# Patient Record
Sex: Female | Born: 1975
Health system: Southern US, Community
[De-identification: ages and names within clinical notes are randomized; demographics above are authoritative.]

## PROBLEM LIST (undated history)

## (undated) DIAGNOSIS — G43909 Migraine, unspecified, not intractable, without status migrainosus: Secondary | ICD-10-CM

## (undated) DIAGNOSIS — M47817 Spondylosis without myelopathy or radiculopathy, lumbosacral region: Secondary | ICD-10-CM

## (undated) DIAGNOSIS — I10 Essential (primary) hypertension: Secondary | ICD-10-CM

## (undated) HISTORY — PX: INTRAUTERINE DEVICE INSERTION: SHX323

## (undated) HISTORY — PX: OTHER SURGICAL HISTORY: SHX169

## (undated) HISTORY — DX: Migraine, unspecified, not intractable, without status migrainosus: G43.909

## (undated) HISTORY — DX: Spondylosis without myelopathy or radiculopathy, lumbosacral region: M47.817

---

## 1997-09-13 HISTORY — PX: CERVICAL BIOPSY  W/ LOOP ELECTRODE EXCISION: SUR135

## 1998-02-08 ENCOUNTER — Inpatient Hospital Stay (HOSPITAL_COMMUNITY): Admission: AD | Admit: 1998-02-08 | Discharge: 1998-02-08 | Payer: Self-pay | Admitting: Obstetrics and Gynecology

## 1998-02-10 ENCOUNTER — Inpatient Hospital Stay (HOSPITAL_COMMUNITY): Admission: AD | Admit: 1998-02-10 | Discharge: 1998-02-10 | Payer: Self-pay | Admitting: Gynecology

## 1998-02-13 ENCOUNTER — Ambulatory Visit (HOSPITAL_COMMUNITY): Admission: RE | Admit: 1998-02-13 | Discharge: 1998-02-13 | Payer: Self-pay | Admitting: Obstetrics and Gynecology

## 1999-03-20 ENCOUNTER — Other Ambulatory Visit: Admission: RE | Admit: 1999-03-20 | Discharge: 1999-03-20 | Payer: Self-pay | Admitting: Obstetrics and Gynecology

## 1999-09-18 ENCOUNTER — Other Ambulatory Visit: Admission: RE | Admit: 1999-09-18 | Discharge: 1999-09-18 | Payer: Self-pay | Admitting: Obstetrics and Gynecology

## 2000-03-23 ENCOUNTER — Emergency Department (HOSPITAL_COMMUNITY): Admission: EM | Admit: 2000-03-23 | Discharge: 2000-03-23 | Payer: Self-pay | Admitting: Internal Medicine

## 2000-04-07 ENCOUNTER — Other Ambulatory Visit: Admission: RE | Admit: 2000-04-07 | Discharge: 2000-04-07 | Payer: Self-pay | Admitting: Obstetrics and Gynecology

## 2000-09-13 HISTORY — PX: GALLBLADDER SURGERY: SHX652

## 2001-05-24 ENCOUNTER — Encounter: Payer: Self-pay | Admitting: Gastroenterology

## 2001-05-24 ENCOUNTER — Ambulatory Visit (HOSPITAL_COMMUNITY): Admission: RE | Admit: 2001-05-24 | Discharge: 2001-05-24 | Payer: Self-pay | Admitting: Gastroenterology

## 2001-07-10 ENCOUNTER — Observation Stay (HOSPITAL_COMMUNITY): Admission: RE | Admit: 2001-07-10 | Discharge: 2001-07-11 | Payer: Self-pay | Admitting: General Surgery

## 2001-07-10 ENCOUNTER — Encounter (HOSPITAL_BASED_OUTPATIENT_CLINIC_OR_DEPARTMENT_OTHER): Payer: Self-pay | Admitting: General Surgery

## 2001-07-10 ENCOUNTER — Encounter (INDEPENDENT_AMBULATORY_CARE_PROVIDER_SITE_OTHER): Payer: Self-pay

## 2002-12-14 ENCOUNTER — Inpatient Hospital Stay (HOSPITAL_COMMUNITY): Admission: AD | Admit: 2002-12-14 | Discharge: 2002-12-17 | Payer: Self-pay | Admitting: *Deleted

## 2003-01-25 ENCOUNTER — Other Ambulatory Visit: Admission: RE | Admit: 2003-01-25 | Discharge: 2003-01-25 | Payer: Self-pay | Admitting: Gynecology

## 2004-01-29 ENCOUNTER — Other Ambulatory Visit: Admission: RE | Admit: 2004-01-29 | Discharge: 2004-01-29 | Payer: Self-pay | Admitting: Gynecology

## 2005-02-10 ENCOUNTER — Other Ambulatory Visit: Admission: RE | Admit: 2005-02-10 | Discharge: 2005-02-10 | Payer: Self-pay | Admitting: Gynecology

## 2006-02-11 ENCOUNTER — Other Ambulatory Visit: Admission: RE | Admit: 2006-02-11 | Discharge: 2006-02-11 | Payer: Self-pay | Admitting: Obstetrics and Gynecology

## 2007-02-15 ENCOUNTER — Other Ambulatory Visit: Admission: RE | Admit: 2007-02-15 | Discharge: 2007-02-15 | Payer: Self-pay | Admitting: Obstetrics and Gynecology

## 2008-05-23 ENCOUNTER — Ambulatory Visit (HOSPITAL_BASED_OUTPATIENT_CLINIC_OR_DEPARTMENT_OTHER): Admission: RE | Admit: 2008-05-23 | Discharge: 2008-05-23 | Payer: Self-pay | Admitting: Orthopedic Surgery

## 2008-10-09 ENCOUNTER — Other Ambulatory Visit: Admission: RE | Admit: 2008-10-09 | Discharge: 2008-10-09 | Payer: Self-pay | Admitting: Obstetrics and Gynecology

## 2008-10-09 ENCOUNTER — Encounter: Payer: Self-pay | Admitting: Obstetrics and Gynecology

## 2008-10-09 ENCOUNTER — Ambulatory Visit: Payer: Self-pay | Admitting: Obstetrics and Gynecology

## 2009-05-05 ENCOUNTER — Ambulatory Visit: Payer: Self-pay | Admitting: Gynecology

## 2009-05-06 ENCOUNTER — Ambulatory Visit: Payer: Self-pay | Admitting: Gynecology

## 2010-06-04 ENCOUNTER — Other Ambulatory Visit: Admission: RE | Admit: 2010-06-04 | Discharge: 2010-06-04 | Payer: Self-pay | Admitting: Obstetrics and Gynecology

## 2010-06-04 ENCOUNTER — Ambulatory Visit: Payer: Self-pay | Admitting: Obstetrics and Gynecology

## 2010-06-08 ENCOUNTER — Encounter: Admission: RE | Admit: 2010-06-08 | Discharge: 2010-06-08 | Payer: Self-pay | Admitting: Obstetrics and Gynecology

## 2010-08-20 ENCOUNTER — Ambulatory Visit: Payer: Self-pay | Admitting: Obstetrics and Gynecology

## 2010-09-13 NOTE — L&D Delivery Note (Signed)
Rather precipitous delivery of viable 6 pound 2 oz. Female Apgars 9/9 over intact perinium.  Patient did not get epidural.  Cord was avulsed and placenta required manual removal after IV restarted and patient received 1mg  IV Stadol.  Inspection of perinium revealed no tears except bilateral periurethral tears that were hemostatic -- no suture placed.  Will breast feed.

## 2010-12-20 ENCOUNTER — Inpatient Hospital Stay (HOSPITAL_COMMUNITY)
Admission: AD | Admit: 2010-12-20 | Discharge: 2010-12-20 | Disposition: A | Discharge status: Home / Self Care | Payer: 59 | Source: Ambulatory Visit | Attending: Obstetrics & Gynecology | Admitting: Obstetrics & Gynecology

## 2010-12-20 ENCOUNTER — Inpatient Hospital Stay (HOSPITAL_COMMUNITY): Payer: 59

## 2010-12-20 DIAGNOSIS — O200 Threatened abortion: Secondary | ICD-10-CM | POA: Insufficient documentation

## 2010-12-20 LAB — URINE MICROSCOPIC-ADD ON

## 2010-12-20 LAB — CBC
Hemoglobin: 14.6 g/dL (ref 12.0–15.0)
MCH: 31.1 pg (ref 26.0–34.0)
MCHC: 35.6 g/dL (ref 30.0–36.0)
Platelets: 145 10*3/uL — ABNORMAL LOW (ref 150–400)
RDW: 12.1 % (ref 11.5–15.5)

## 2010-12-20 LAB — URINALYSIS, ROUTINE W REFLEX MICROSCOPIC
Glucose, UA: NEGATIVE mg/dL
Leukocytes, UA: NEGATIVE
pH: 6 (ref 5.0–8.0)

## 2010-12-20 LAB — RPR: RPR: NONREACTIVE

## 2010-12-20 LAB — POCT PREGNANCY, URINE: Preg Test, Ur: POSITIVE

## 2010-12-20 LAB — ABO/RH: ABO/RH(D): A POS

## 2010-12-21 ENCOUNTER — Other Ambulatory Visit: Payer: Self-pay | Admitting: Obstetrics & Gynecology

## 2011-01-04 LAB — ANTIBODY SCREEN: Antibody Screen: NEGATIVE

## 2011-01-26 NOTE — Op Note (Signed)
[name], [name]                 ACCOUNT NO.:  [id number]   MEDICAL RECORD NO.:  [id number]          PATIENT TYPE:  AMB   LOCATION:  [facility]                         FACILITY:  [facility]   PHYSICIAN:  Deidre Ala, M.D.    DATE OF BIRTH:  [birth year]   DATE OF PROCEDURE:  05/23/2008  DATE OF DISCHARGE:                               OPERATIVE REPORT   PREOPERATIVE DIAGNOSIS:  Lateral meniscus tear.   POSTOPERATIVE DIAGNOSES:  1. Discoid lateral meniscus with horizontal tear.  2. Medial and lateral plicas.  3. Tight lateral retinaculum.   PROCEDURES:  1. Left knee operative arthroscopy with debridement of discoid lateral      meniscus and lateral meniscus tear.  2. Medial and lateral plica excision.  3. Lateral retinacular release.   SURGEON:  1. Charlesetta Shanks, MD   ASSISTANT:  Phineas Semen, P.A.   ANESTHESIA:  General with LMA.   CULTURES:  None.   DRAINS:  None.   ESTIMATED BLOOD LOSS:  Minimal.   TOURNIQUET TIME:  35 minutes.   PATHOLOGIC FINDINGS AND HISTORY:  Betty Hall presented to me recently.  Two  years ago she had an arthroscopy of the right knee with a meniscal tear  on the lateral side.  She had similar symptoms and it hurts especially  with crossing her leg over on the right.  She had very minimally  positive lateral McMurray's, tenderness at the lateral joint line.  We  felt with that history she most likely had lateral meniscus tear with  those symptoms.  She declined a preoperative MRI because the last MRI  had not clearly shown the tear and so we went ahead with diagnostic and  operative arthroscopy.  She a large medial plica, medial meniscus was  intact, lateral meniscus was a huge discoid lateral discoid meniscus  with a horizontal tear that was unstable.  This we just debrided to  normal meniscal margins and sealed the edge with the ablator removing  the tear.  She had a lateral plica and she had a tight lateral  retinaculum with soft medial patella.  Lateral  retinacular release and  plica excisions were carried out.   PROCEDURE IN DETAIL:  With adequate anesthesia obtained using LMA  technique, 1 gram Ancef given IV prophylaxis, the patient was placed in  the supine position.  The left lower extremity was prepped from the  malleoli to the leg holder in a standard fashion.  After standard  prepping and draping Esmarch exsanguination was used.  The tourniquet  was let up to 350 mmHg.  Superior lateral inflow portal was made.  The  knee was insufflated with normal saline with arthroscopic pump.  Medial  and lateral scope portals were then made and the joint was thoroughly  inspected.  I then shaved the medial plica back to the sidewall and  lysed the medial band.  I then checked the medial meniscus, checked the  ACL, reversed portals, found the discoid lateral meniscus and used  basket and shaver to saucerize it to normal margins removing the tear  centrally that was horizontal.  Ablator was used on 1 to smooth.  Lateral plica was excised, tilted and track was observed and lateral  retinacular release was carried out with bleeding points cauterized.  The knee was irrigated through the scope, 0.5% Marcaine with morphine  was injected in and about the joint.  The portals were left open.  Bulky  sterile compressive dressing was applied with lateral foam pad for  tamponade and EZ-wrap placed.  The patient then having tolerated the  procedure well was awakened and taken to the recovery room in  satisfactory condition to be discharged per outpatient routine, given  Percocet for pain and told to call the office for recheck tomorrow.           ______________________________  V. Charlesetta Shanks, M.D.     VEP/MEDQ  D:  05/23/2008  T:  05/24/2008  Job:  [id number]

## 2011-01-29 NOTE — Discharge Summary (Signed)
   NAME:  Betty Hall, Betty Hall                           ACCOUNT NO.:  [id number]   MEDICAL RECORD NO.:  [id number]                   PATIENT TYPE:  INP   LOCATION:  9103                                 FACILITY:  [facility]   PHYSICIAN:  Juan H. Lily Peer, M.D.             DATE OF BIRTH:  September 06, 1976   DATE OF ADMISSION:  12/14/2002  DATE OF DISCHARGE:  12/17/2002                                 DISCHARGE SUMMARY   DISCHARGE DIAGNOSIS:  1. 37+ weeks, delivered.  2. Chronic hypertension on Procardia.  3. Suspected intrauterine growth retardation.  4. Status post vacuum assisted vaginal delivery.   HISTORY:  The patient is a 35 year old female gravida 2, para 0, AB-1, with  an EDC of 01/01/03.  Prenatal course had been complicated by a diagnosis of  chronic hypertension and she was placed on Procardia during pregnancy and  was followed with antepartum testing.  On 12/14/02 the patient underwent  ultrasound which revealed an estimated fetal weight within the 7th  percentile with a weight of 2582, and for suspected IUGR patient was  admitted for induction.   HOSPITAL COURSE:  On 12/14/02 patient was admitted at 37+ weeks, given  Cervidil all night and began Pitocin on the following a.m. on 12/15/02.  Subsequently patient underwent a vacuum assisted vaginal delivery, secondary  to deep period of decelerations, of a female, Apgar's of 8 and 8, weight of  6 pounds and 2 ounces.  Midline episiotomy third degree extension.  There  were noted to be no complications.  Postpartum the patient was afebrile, voiding, in stable condition.  Her  blood pressure remained within normal limits, was not on Procardia.  She was  felt table for discharge to home on 12/17/02.   LABS:  The patient was A positive, rubella immune.  On 12/16/02 hemoglobin  10.9.   DISPOSITION:  The patient was discharged to home.  Follow up in six weeks,  if any problem prior to that contact the office.     Susa Loffler, P.A.                     Juan H. Lily Peer, M.D.    TSG/MEDQ  D:  01/11/2003  T:  01/11/2003  Job:  [id number]

## 2011-01-29 NOTE — Op Note (Signed)
[facility]  Patient:    Betty Hall, Betty Hall Visit Number: [id number] MRN: [id number]          Service Type: SUR Location: 3W 0357 02 Attending Physician:  Fortino Sic Dictated by:   Marnee Spring. Wiliam Ke, M.D. Proc. Date: 07/10/01 Admit Date:  07/10/2001 Discharge Date: 07/11/2001   CC:         Molly Maduro D. Arlyce Dice, M.D. [facility]             Daniel L. Eda Paschal, M.D.                           Operative Report  PREOPERATIVE DIAGNOSIS:  Symptomatic gallstones.  POSTOPERATIVE DIAGNOSIS:  Symptomatic gallstones.  OPERATION PERFORMED:  Laparoscopic cholecystectomy with intraoperative cholangiogram.  SURGEON:  Marnee Spring. Wiliam Ke, M.D.  ASSISTANT:  Mardene Celeste. Lurene Shadow, M.D.  ANESTHESIA:  Endotracheal by hospital.  DESCRIPTION OF PROCEDURE:  Under good endotracheal anesthesia, the skin of the abdomen was prepped and draped in the usual manner.  An incision was made below the umbilicus.  The fascia was identified.  This was opened and a pursestring suture was placed.  The Hasson cannula was inserted and pneumoperitoneum was obtained.  The pelvic organs appeared normal as did the rest of the peritoneal surfaces.  The gallbladder was inflamed and thickened. A 10 mm and two 5 mm cannulas were placed in the usual fashion.  Dissecting the porta hepatis, the cystic duct and gallbladder junction was identified. The cystic duct was opened.  Cholangiogram was performed using a Reddick catheter, and a fluoroscopic technique.  This was normal.  The cystic duct was triply clipped and cut __________.  The cystic artery was triply clipped and cut leaving two clips proximally.  The gallbladder was removed from the liver bed with electrocautery current and hemostasis was obtained with electrocautery current.  The gallbladder was removed via the umbilical port in an Endopouch.  The right upper quadrant was copiously irrigated with saline and sucked dry.  After the cannula was removed, the  pursestring suture was tied, and then the wounds were closed with 4-0 Vicryl subcuticularly and Steri-Strips.  Estimated blood loss minimal.  The patient received no blood and left the operating room in stable condition after sponge and needle counts were verified. Dictated by:   Marnee Spring. Wiliam Ke, M.D. Attending Physician:  Fortino Sic DD:  07/10/01 TD:  07/11/01 Job: [id number]

## 2011-01-29 NOTE — H&P (Signed)
NAME:  Betty Hall, Betty Hall                           ACCOUNT NO.:  [id number]   MEDICAL RECORD NO.:  [id number]                   PATIENT TYPE:  MAT   LOCATION:  [facility]                                 FACILITY:  [facility]   PHYSICIAN:  Juan H. Lily Peer, M.D.             DATE OF BIRTH:  09/01/1976   DATE OF ADMISSION:  12/14/2002  DATE OF DISCHARGE:                                HISTORY & PHYSICAL   CHIEF COMPLAINT:  1. Term intrauterine pregnancy at 37-4/7 weeks' estimated gestational age.  2. Severe IUGR.   HISTORY OF PRESENT ILLNESS:  The patient is a 35 year old, G2, P0, AB1 with  last menstrual period on March 25, 2002, with estimated date of confinement  January 01, 2003, currently 37-4/7 weeks' estimated gestational age.  The  patient was diagnosed during her pregnancy with what appears to be chronic  hypertension and has been placed on Procardia 30 mg XL daily and has started  to receive third trimester antepartum testing.   Today, when she presented to the office, there was clinical evidence of IUGR  where by the estimated fetal weight was in the 7th percentile with a weight  of 2582 g.  Head circumference, abdominal circumference and BPD were  consistent with 34 weeks and femur length with 36 weeks.  The AFI was 16.2  in the 65th percentile for 38 weeks' gestation.  There was a left lateral  placenta grade 2, vertex presentation with fetal heart rate 133 bpm.  The  remainder of her prenatal course was unremarkable.  She did have a 24 hour  urine protein and creatinine clearance.  The 24 hour urine collection on  January 19, showed normal parameters as well as her comprehensive metabolic  panel.  Her Group B Streptococcus culture is negative and she had a reactive  NST today as well as her previous visits when she had been followed with AFI  and nonstress test.   MEDICATIONS:  Procardia 30 mg XL daily.   PAST MEDICAL HISTORY:  1. D&C for spontaneous AB back in July 1999.  2.  Cholecystectomy in 2002.   REVIEW OF SYMPTOMS:  See form.   PHYSICAL EXAMINATION:  VITAL SIGNS:  Weight 202 pounds.  No protein in  urine.  Blood pressure 120/70.  HEENT:  Unremarkable.  NECK:  Supple.  Trachea midline.  No carotid bruits or thyromegaly.  LUNGS:  Clear to auscultation without rhonchi or wheezes.  HEART:  Regular rate and rhythm with no murmurs, rubs or gallops.  BREASTS:  Not done.  ABDOMEN:  Gravid uterus.  Vertex presentation by [facility] maneuver confirmed  by ultrasound.  Fetal heart tones were in the 130-140s bpm range.  PELVIC:  Cervix was closed, 50% and ballottable.  EXTREMITIES:  DTRs 1+, negative clonus.  Trace edema.   PRENATAL LABORATORY DATA:  A positive blood type.  Negative antibody screen.  VDRL and hepatitis B surface  antigen and HIV were negative.  Rubella immune.  Alpha fetoprotein was normal.  Diabetes screen was normal.  GBS culture was  negative.   ASSESSMENT:  This is a 35 year old, G2, P0, AB1 at 37-4/7 weeks' estimated  gestational age with history of chronic hypertension on Procardia 30 mg XL  daily.  On testing today, she was found to have intrauterine growth  restriction in the 7th percentile for [redacted] week gestation with a weight of  2582 g (5 pounds 11 ounces).  Amniotic fluid index was normal at 16.2 cm in  the 65th percentile for 38 weeks.  Placenta was posterolateral, grade 2.  Rate 133 bpm and vertex presentation.  Group B Streptococcus culture was  negative.   PLAN:  The patient will be admitted to [facility] tonight and begin  induction with Cervidil for cervical ripening.  After 10-12 hours, we will  remove the Cervidil and start high dose Pitocin in an effort to deliver this  baby.  The patient is fully aware this could be a serial induction providing  that the maternal as well as fetal vital signs continue to be stable.  The  risks, benefits, pros and cons were discussed with the patient.  All  questions were answered.  Will  follow accordingly.  Plan is per assessment  above.                                               Juan H. Lily Peer, M.D.    JHF/MEDQ  D:  12/14/2002  T:  12/14/2002  Job:  [id number]

## 2011-06-16 LAB — POCT I-STAT 4, (NA,K, GLUC, HGB,HCT)
HCT: 49 — ABNORMAL HIGH
Hemoglobin: 16.7 — ABNORMAL HIGH
Sodium: 138

## 2011-08-06 ENCOUNTER — Encounter (HOSPITAL_COMMUNITY): Payer: Self-pay | Admitting: *Deleted

## 2011-08-06 ENCOUNTER — Inpatient Hospital Stay (HOSPITAL_COMMUNITY)
Admission: AD | Admit: 2011-08-06 | Discharge: 2011-08-07 | DRG: 774 | Disposition: A | Discharge status: Home / Self Care | Payer: 59 | Source: Ambulatory Visit | Attending: Obstetrics & Gynecology | Admitting: Obstetrics & Gynecology

## 2011-08-06 DIAGNOSIS — Z3480 Encounter for supervision of other normal pregnancy, unspecified trimester: Secondary | ICD-10-CM

## 2011-08-06 HISTORY — DX: Essential (primary) hypertension: I10

## 2011-08-06 LAB — CBC
HCT: 40.5 % (ref 36.0–46.0)
MCHC: 35.8 g/dL (ref 30.0–36.0)
MCV: 92 fL (ref 78.0–100.0)
RDW: 13.2 % (ref 11.5–15.5)
WBC: 21.7 10*3/uL — ABNORMAL HIGH (ref 4.0–10.5)

## 2011-08-06 MED ORDER — ONDANSETRON HCL 4 MG/2ML IJ SOLN: 4.0000 mg | INTRAMUSCULAR | Status: DC | PRN

## 2011-08-06 MED ORDER — LACTATED RINGERS IV SOLN
INTRAVENOUS | Status: DC
  Administered 2011-08-06: 19:00:00 via INTRAVENOUS

## 2011-08-06 MED ORDER — ONDANSETRON HCL 4 MG/2ML IJ SOLN: 4.0000 mg | Freq: Four times a day (QID) | INTRAMUSCULAR | Status: DC | PRN

## 2011-08-06 MED ORDER — LACTATED RINGERS IV SOLN: 500.0000 mL | INTRAVENOUS | Status: DC | PRN

## 2011-08-06 MED ORDER — BENZOCAINE-MENTHOL 20-0.5 % EX AERO
INHALATION_SPRAY | CUTANEOUS | Status: AC
  Administered 2011-08-06: 23:00:00
  Filled 2011-08-06: qty 56

## 2011-08-06 MED ORDER — PRENATAL PLUS 27-1 MG PO TABS
1.0000 | ORAL_TABLET | Freq: Every day | ORAL | Status: DC
  Administered 2011-08-07: 1 via ORAL
  Filled 2011-08-06: qty 1

## 2011-08-06 MED ORDER — ACETAMINOPHEN 325 MG PO TABS: 650.0000 mg | ORAL_TABLET | ORAL | Status: DC | PRN

## 2011-08-06 MED ORDER — SODIUM CHLORIDE 0.9 % IJ SOLN: 3.0000 mL | Freq: Two times a day (BID) | INTRAMUSCULAR | Status: DC

## 2011-08-06 MED ORDER — SIMETHICONE 80 MG PO CHEW: 80.0000 mg | CHEWABLE_TABLET | ORAL | Status: DC | PRN

## 2011-08-06 MED ORDER — SODIUM CHLORIDE 0.9 % IJ SOLN: 3.0000 mL | INTRAMUSCULAR | Status: DC | PRN

## 2011-08-06 MED ORDER — OXYCODONE-ACETAMINOPHEN 5-325 MG PO TABS: 2.0000 | ORAL_TABLET | ORAL | Status: DC | PRN

## 2011-08-06 MED ORDER — SODIUM CHLORIDE 0.9 % IV SOLN: 250.0000 mL | INTRAVENOUS | Status: DC

## 2011-08-06 MED ORDER — IBUPROFEN 600 MG PO TABS
600.0000 mg | ORAL_TABLET | Freq: Four times a day (QID) | ORAL | Status: DC | PRN
  Filled 2011-08-06: qty 1

## 2011-08-06 MED ORDER — PHENYLEPHRINE 40 MCG/ML (10ML) SYRINGE FOR IV PUSH (FOR BLOOD PRESSURE SUPPORT): 80.0000 ug | PREFILLED_SYRINGE | INTRAVENOUS | Status: DC | PRN

## 2011-08-06 MED ORDER — BUTORPHANOL TARTRATE 2 MG/ML IJ SOLN
INTRAMUSCULAR | Status: AC
  Administered 2011-08-06: 1 mg via INTRAVENOUS
  Filled 2011-08-06: qty 1

## 2011-08-06 MED ORDER — WITCH HAZEL-GLYCERIN EX PADS: 1.0000 "application " | MEDICATED_PAD | CUTANEOUS | Status: DC | PRN

## 2011-08-06 MED ORDER — BUTORPHANOL TARTRATE 2 MG/ML IJ SOLN
1.0000 mg | Freq: Once | INTRAMUSCULAR | Status: AC
  Administered 2011-08-06: 1 mg via INTRAVENOUS

## 2011-08-06 MED ORDER — DIBUCAINE 1 % RE OINT: 1.0000 "application " | TOPICAL_OINTMENT | RECTAL | Status: DC | PRN

## 2011-08-06 MED ORDER — IBUPROFEN 600 MG PO TABS
600.0000 mg | ORAL_TABLET | Freq: Four times a day (QID) | ORAL | Status: DC
  Administered 2011-08-06 – 2011-08-07 (×5): 600 mg via ORAL
  Filled 2011-08-06 (×4): qty 1

## 2011-08-06 MED ORDER — CITRIC ACID-SODIUM CITRATE 334-500 MG/5ML PO SOLN: 30.0000 mL | ORAL | Status: DC | PRN

## 2011-08-06 MED ORDER — LACTATED RINGERS IV SOLN
500.0000 mL | Freq: Once | INTRAVENOUS | Status: AC
  Administered 2011-08-06: 500 mL via INTRAVENOUS

## 2011-08-06 MED ORDER — OXYCODONE-ACETAMINOPHEN 5-325 MG PO TABS
1.0000 | ORAL_TABLET | ORAL | Status: DC | PRN
  Administered 2011-08-06: 1 via ORAL
  Filled 2011-08-06: qty 1

## 2011-08-06 MED ORDER — EPHEDRINE 5 MG/ML INJ
10.0000 mg | INTRAVENOUS | Status: DC | PRN
  Filled 2011-08-06: qty 4

## 2011-08-06 MED ORDER — LANOLIN HYDROUS EX OINT: TOPICAL_OINTMENT | CUTANEOUS | Status: DC | PRN

## 2011-08-06 MED ORDER — EPHEDRINE 5 MG/ML INJ: 10.0000 mg | INTRAVENOUS | Status: DC | PRN

## 2011-08-06 MED ORDER — ONDANSETRON HCL 4 MG PO TABS: 4.0000 mg | ORAL_TABLET | ORAL | Status: DC | PRN

## 2011-08-06 MED ORDER — SENNOSIDES-DOCUSATE SODIUM 8.6-50 MG PO TABS
2.0000 | ORAL_TABLET | Freq: Every day | ORAL | Status: DC
  Administered 2011-08-06: 2 via ORAL

## 2011-08-06 MED ORDER — OXYTOCIN 20 UNITS IN LACTATED RINGERS INFUSION - SIMPLE
125.0000 mL/h | Freq: Once | INTRAVENOUS | Status: AC
  Administered 2011-08-06: 125 mL/h via INTRAVENOUS

## 2011-08-06 MED ORDER — TETANUS-DIPHTH-ACELL PERTUSSIS 5-2.5-18.5 LF-MCG/0.5 IM SUSP: 0.5000 mL | Freq: Once | INTRAMUSCULAR | Status: DC

## 2011-08-06 MED ORDER — OXYTOCIN BOLUS FROM INFUSION
500.0000 mL | Freq: Once | INTRAVENOUS | Status: DC
  Filled 2011-08-06: qty 1000
  Filled 2011-08-06: qty 500

## 2011-08-06 MED ORDER — PHENYLEPHRINE 40 MCG/ML (10ML) SYRINGE FOR IV PUSH (FOR BLOOD PRESSURE SUPPORT)
80.0000 ug | PREFILLED_SYRINGE | INTRAVENOUS | Status: DC | PRN
  Filled 2011-08-06: qty 5

## 2011-08-06 MED ORDER — BENZOCAINE-MENTHOL 20-0.5 % EX AERO: 1.0000 "application " | INHALATION_SPRAY | CUTANEOUS | Status: DC | PRN

## 2011-08-06 MED ORDER — LIDOCAINE HCL (PF) 1 % IJ SOLN
30.0000 mL | INTRAMUSCULAR | Status: DC | PRN
  Filled 2011-08-06: qty 30

## 2011-08-06 MED ORDER — DIPHENHYDRAMINE HCL 25 MG PO CAPS: 25.0000 mg | ORAL_CAPSULE | Freq: Four times a day (QID) | ORAL | Status: DC | PRN

## 2011-08-06 MED ORDER — ZOLPIDEM TARTRATE 5 MG PO TABS: 5.0000 mg | ORAL_TABLET | Freq: Every evening | ORAL | Status: DC | PRN

## 2011-08-06 MED ORDER — DIPHENHYDRAMINE HCL 50 MG/ML IJ SOLN: 12.5000 mg | INTRAMUSCULAR | Status: DC | PRN

## 2011-08-06 MED ORDER — FENTANYL 2.5 MCG/ML BUPIVACAINE 1/10 % EPIDURAL INFUSION (WH - ANES)
14.0000 mL/h | INTRAMUSCULAR | Status: DC
  Filled 2011-08-06: qty 60

## 2011-08-06 MED ORDER — FLEET ENEMA 7-19 GM/118ML RE ENEM: 1.0000 | ENEMA | RECTAL | Status: DC | PRN

## 2011-08-06 NOTE — Progress Notes (Signed)
Pt to room 147 via wc in stable condition.

## 2011-08-06 NOTE — H&P (Signed)
35 year old G4P1 at 38 weeks and 5 days admitted in labor with SROM a short while ago.  Pregnancy relatively benign.  -GBS.  EDC 12/2.  PE:  VS stable Abd:  S+36  FH reassuring CX:  (by MAU) 4/Vtx -1  IMP:  Term IUP/Active labor with SROM Plan:  Expectant management.

## 2011-08-06 NOTE — Progress Notes (Signed)
U/C since 0715 and progressively come closer together.  Possible ROM at 1655 after arriving to MAU.

## 2011-08-07 LAB — CBC
HCT: 33.9 % — ABNORMAL LOW (ref 36.0–46.0)
MCH: 31.5 pg (ref 26.0–34.0)
MCHC: 34.2 g/dL (ref 30.0–36.0)
RDW: 13.5 % (ref 11.5–15.5)

## 2011-08-07 NOTE — Progress Notes (Signed)
Stable.  CBC today:  11.6/18.1 with 215 K platelets.  Breast.  Likely D/C to home this evening, pending baby's D/C.

## 2011-08-07 NOTE — Discharge Summary (Addendum)
Discharge diagnoses  #1-38 week and 5 day intrauterine pregnancy delivered 6 lbs. 2 oz. Female infant Apgars 9 and 9  #2-blood type A-positive  Procedures  Normal spontaneous delivery  Summary-this 35 year old gravida 4 now para 2 with a due date of December 2 presented in very active labor and had spontaneous rupture membranes upon arrival at MAU.  Although her first baby was born 8 years ago her labor then was only one hour long.  At the time of her exam in MAU, she was 4 cm dilated with the vertex at about 0 station. As I was in the process of doing another delivery at the time, I could not physically put in orders  to admit the patient and allow her to get her epidural which she desired. A verbal order was not accepted by the MAU staff.  20 minutes after I saw the patient ( upon completing the delivery I was involved with and put in her orders)  she precipitously delivered without having received her epidural.  She had a normal spontaneous delivery of a 6 lbs. 2 oz. Female infant with Apgars of 9 and 9 over an intact perineum. The placenta did require manual removal as the cord was torn.    On the morning of 11/24 both mother and baby were doing well. A CBC on the mother on 11/24 was 11.6/18.9 with 215,000 platelets. It was very likely the baby would be able to be discharged after 24 hours of observation - on the evening of 11/24. Accordingly,mom was also discharged.  She was given a discharge of her short understood all instructions well. Medications include vitamins-1 a day as long as breast-feeding.She was also given a prescription for Motrin 600 mg to use every 6 hours as needed for cramping or mild pain.She will return to the office for followup in approximately 4 weeks time or as needed.  Condition on discharge improved.

## 2011-08-16 ENCOUNTER — Encounter (HOSPITAL_COMMUNITY): Payer: Self-pay | Admitting: *Deleted

## 2011-08-27 ENCOUNTER — Other Ambulatory Visit: Payer: Self-pay | Admitting: Obstetrics & Gynecology

## 2012-11-08 ENCOUNTER — Encounter: Payer: 59 | Admitting: Gynecology

## 2012-11-27 ENCOUNTER — Ambulatory Visit: Payer: 59 | Admitting: Gynecology

## 2012-11-27 ENCOUNTER — Encounter: Payer: Self-pay | Admitting: Gynecology

## 2012-11-27 VITALS — BP 128/84 | Ht 61.5 in | Wt 131.0 lb

## 2012-11-27 DIAGNOSIS — N871 Moderate cervical dysplasia: Secondary | ICD-10-CM | POA: Insufficient documentation

## 2012-11-27 DIAGNOSIS — Z01419 Encounter for gynecological examination (general) (routine) without abnormal findings: Secondary | ICD-10-CM

## 2012-11-27 DIAGNOSIS — J069 Acute upper respiratory infection, unspecified: Secondary | ICD-10-CM

## 2012-11-27 DIAGNOSIS — N643 Galactorrhea not associated with childbirth: Secondary | ICD-10-CM

## 2012-11-27 DIAGNOSIS — R634 Abnormal weight loss: Secondary | ICD-10-CM

## 2012-11-27 LAB — CBC WITH DIFFERENTIAL/PLATELET
Basophils Relative: 0 % (ref 0–1)
Eosinophils Absolute: 0.1 10*3/uL (ref 0.0–0.7)
Eosinophils Relative: 2 % (ref 0–5)
HCT: 44 % (ref 36.0–46.0)
Hemoglobin: 15.1 g/dL — ABNORMAL HIGH (ref 12.0–15.0)
MCH: 28.8 pg (ref 26.0–34.0)
MCHC: 34.3 g/dL (ref 30.0–36.0)
MCV: 84 fL (ref 78.0–100.0)
Monocytes Absolute: 0.4 10*3/uL (ref 0.1–1.0)
Monocytes Relative: 9 % (ref 3–12)

## 2012-11-27 LAB — TSH: TSH: 1.425 u[IU]/mL (ref 0.350–4.500)

## 2012-11-27 MED ORDER — AZITHROMYCIN 250 MG PO TABS: ORAL_TABLET | ORAL | Status: DC

## 2012-11-27 MED ORDER — FLUCONAZOLE 100 MG PO TABS: ORAL_TABLET | ORAL | Status: DC

## 2012-11-27 MED ORDER — DOXYCYCLINE HYCLATE 100 MG PO CAPS: 100.0000 mg | ORAL_CAPSULE | Freq: Two times a day (BID) | ORAL | Status: DC

## 2012-11-27 NOTE — Patient Instructions (Addendum)

## 2012-11-27 NOTE — Progress Notes (Signed)
NO MENSES DUE TO IUD 

## 2012-11-27 NOTE — Progress Notes (Signed)
Betty Hall 1976/07/11 [id number]   History:    37 y.o.  for annual gyn exam who was also complaining of a raised area near her lower groin area. Patient stated she noticed this area that she thought was an abscess about 3 weeks ago. Patient delivered vaginally approximately a year and a half ago and has a Mirena IUD. She states that she occasionally has bilateral galactorrhea but denies any visual disturbances, or headaches. She stopped smoking in 2011. Review of her record indicated she had a LEEP cervical conization 1999 for CIN-2 margins were negative. Subsequent Pap smears of been normal. She does her monthly self breast examination. She had a mammogram in 2011 whereby only fatty tissue a small intramammary lymph node was noted. Patient was concerned as well of her weight loss. She weighed 153 pounds before pregnancy and at time of delivery she was weighing 189 pounds and then 185 pounds after delivery and Is down to 131 pounds now and not really working hard at it. Patient also complaining for the past several days a nonproductive cough since she's had other family members sick at home. She's also had some ear pressure.   Past medical history,surgical history, family history and social history were all reviewed and documented in the EPIC chart.  Gynecologic History Patient's last menstrual period was 11/13/2012. Contraception: IUD Last Pap: 2012. Results were: normal Last mammogram: see above. Results were: see above  Obstetric History OB History   Grav Para Term Preterm Abortions TAB SAB Ect Mult Living   4 2 2  0 1 0 1 0 0 1     # Outc Date GA Lbr Len/2nd Wgt Sex Del Anes PTL Lv   1 TRM 11/12 [redacted]w[redacted]d 04:33 / 00:03 6lb2.1oz(2.781kg) F SVD None     2 GRA            3 TRM            4 SAB                ROS: A ROS was performed and pertinent positives and negatives are included in the history.  GENERAL: No fevers or chills. HEENT: No change in vision, no earache, sore throat or  sinus congestion. NECK: No pain or stiffness. CARDIOVASCULAR: No chest pain or pressure. No palpitations. PULMONARY: nonproductive cough GASTROINTESTINAL: No abdominal pain, nausea, vomiting or diarrhea, melena or bright red blood per rectum. GENITOURINARY: No urinary frequency, urgency, hesitancy or dysuria. MUSCULOSKELETAL: No joint or muscle pain, no back pain, no recent trauma. DERMATOLOGIC: No rash, no itching, no lesions. ENDOCRINE: No polyuria, polydipsia, no heat or cold intolerance. No recent change in weight. HEMATOLOGICAL: No anemia or easy bruising or bleeding. NEUROLOGIC: No headache, seizures, numbness, tingling or weakness. PSYCHIATRIC: No depression, no loss of interest in normal activity or change in sleep pattern.     Exam: chaperone present  BP 128/84  Ht 5' 1.5" (1.562 m)  Wt 131 lb (59.421 kg)  BMI 24.35 kg/m2  LMP 11/13/2012  Body mass index is 24.35 kg/(m^2).  General appearance : Well developed well nourished female. No acute distress HEENT: Neck supple, trachea midline, no carotid bruits, no thyroidmegaly Lungs:inspiratory rhonchi with very little wheezing on the upper lung field Heart: Regular rate and rhythm, no murmurs or gallops Breast:Examined in sitting and supine position were symmetrical in appearance, no palpable masses or tenderness,  no skin retraction, no nipple inversion, no nipple discharge, no skin discoloration, no axillary or supraclavicular lymphadenopathy Abdomen: no  palpable masses or tenderness, no rebound or guarding Extremities: no edema or skin discoloration or tenderness  Pelvic:  Bartholin, Urethra, Skene Glands: Within normal limits             Vagina: No gross lesions or discharge  Cervix: No gross lesions or discharge  Uterus  anteverted, normal size, shape and consistency, non-tender and mobile  Adnexa  Without masses or tenderness  Anus and perineum  normal   Rectovaginal  normal sphincter tone without palpated masses or  tenderness             Hemoccult not done   A 1-1/2-2 cm slightly indurated area lateral to the right buttock crease approximately 4 finger breast from the fourchette. Minimally erythematous or tender. Possibly epidermal inclusion cyst  Assessment/Plan:  37 y.o. female for annual exam  Patient with what appears to be a small epidermal inclusion cyst versus a right follicular cyst with minimal erythema. She is going to be placed on Vibramycin 100 mg twice a day for 14 days. She will do sitz baths every night. She will apply Bactroban or Neosporin twice a day. She will return back to the office in 2 weeks for followup. Patient with a mild bronchitis will be treated with the above antibiotic as well. The following labs were ordered: CBC, screening cholesterol, TSH, hemoglobin A1c, as well urinalysis. No Pap smear done today the new screening guidelines discussed. Patient reminded to do her monthly self breast examination. We are also going to check her prolactin level as well.    Ok Edwards MD, 2:08 PM 11/27/2012

## 2012-11-28 ENCOUNTER — Encounter: Payer: Self-pay | Admitting: Women's Health

## 2012-11-28 LAB — URINALYSIS W MICROSCOPIC + REFLEX CULTURE
Glucose, UA: NEGATIVE mg/dL
Leukocytes, UA: NEGATIVE
Protein, ur: NEGATIVE mg/dL
Specific Gravity, Urine: 1.006 (ref 1.005–1.030)
Squamous Epithelial / LPF: NONE SEEN
pH: 7 (ref 5.0–8.0)

## 2013-07-19 ENCOUNTER — Other Ambulatory Visit: Payer: Self-pay

## 2013-12-06 ENCOUNTER — Encounter: Payer: Self-pay | Admitting: Gynecology

## 2013-12-06 ENCOUNTER — Other Ambulatory Visit (HOSPITAL_COMMUNITY)
Admission: RE | Admit: 2013-12-06 | Discharge: 2013-12-06 | Disposition: A | Discharge status: Home / Self Care | Payer: 59 | Source: Ambulatory Visit | Attending: Gynecology | Admitting: Gynecology

## 2013-12-06 ENCOUNTER — Ambulatory Visit (INDEPENDENT_AMBULATORY_CARE_PROVIDER_SITE_OTHER): Payer: 59 | Admitting: Gynecology

## 2013-12-06 VITALS — BP 130/84 | Ht 61.75 in | Wt 131.6 lb

## 2013-12-06 DIAGNOSIS — Z1151 Encounter for screening for human papillomavirus (HPV): Secondary | ICD-10-CM | POA: Insufficient documentation

## 2013-12-06 DIAGNOSIS — Z01419 Encounter for gynecological examination (general) (routine) without abnormal findings: Secondary | ICD-10-CM | POA: Insufficient documentation

## 2013-12-06 LAB — CBC WITH DIFFERENTIAL/PLATELET
BASOS PCT: 0 % (ref 0–1)
Basophils Absolute: 0 10*3/uL (ref 0.0–0.1)
EOS ABS: 0.1 10*3/uL (ref 0.0–0.7)
Eosinophils Relative: 1 % (ref 0–5)
HCT: 46.5 % — ABNORMAL HIGH (ref 36.0–46.0)
HEMOGLOBIN: 16.1 g/dL — AB (ref 12.0–15.0)
Lymphocytes Relative: 32 % (ref 12–46)
Lymphs Abs: 1.8 10*3/uL (ref 0.7–4.0)
MCH: 30.3 pg (ref 26.0–34.0)
MCHC: 34.6 g/dL (ref 30.0–36.0)
MCV: 87.6 fL (ref 78.0–100.0)
MONOS PCT: 7 % (ref 3–12)
Monocytes Absolute: 0.4 10*3/uL (ref 0.1–1.0)
NEUTROS ABS: 3.3 10*3/uL (ref 1.7–7.7)
NEUTROS PCT: 60 % (ref 43–77)
PLATELETS: 163 10*3/uL (ref 150–400)
RBC: 5.31 MIL/uL — AB (ref 3.87–5.11)
RDW: 13.3 % (ref 11.5–15.5)
WBC: 5.5 10*3/uL (ref 4.0–10.5)

## 2013-12-06 LAB — CHOLESTEROL, TOTAL: Cholesterol: 116 mg/dL (ref 0–200)

## 2013-12-06 LAB — COMPREHENSIVE METABOLIC PANEL
ALBUMIN: 4.6 g/dL (ref 3.5–5.2)
ALK PHOS: 42 U/L (ref 39–117)
ALT: 8 U/L (ref 0–35)
AST: 11 U/L (ref 0–37)
BILIRUBIN TOTAL: 0.9 mg/dL (ref 0.2–1.2)
BUN: 9 mg/dL (ref 6–23)
CO2: 28 mEq/L (ref 19–32)
Calcium: 9.4 mg/dL (ref 8.4–10.5)
Chloride: 102 mEq/L (ref 96–112)
Creat: 0.82 mg/dL (ref 0.50–1.10)
Glucose, Bld: 80 mg/dL (ref 70–99)
POTASSIUM: 4.2 meq/L (ref 3.5–5.3)
SODIUM: 138 meq/L (ref 135–145)
TOTAL PROTEIN: 6.8 g/dL (ref 6.0–8.3)

## 2013-12-06 NOTE — Addendum Note (Signed)
Addended by: Bertram SavinGONZALEZ-Jannice Beitzel A on: 12/06/2013 09:43 AM   Modules accepted: Orders

## 2013-12-06 NOTE — Progress Notes (Signed)
Betty Hall [birth year] [id number]   History:    38 y.o.  presented to the office today for her annual exam. Patient's asymptomatic. Patient had a Mirena IUD placed in 2013 and has done well.She stopped smoking in 2011. Review of her record indicated she had a LEEP cervical conization 1999 for CIN-2 margins were negative. Subsequent Pap smears of been normal. She does her monthly self breast examination. She had a mammogram in 2011 whereby only fatty tissue a small intramammary lymph node was noted.   Past medical history,surgical history, family history and social history were all reviewed and documented in the EPIC chart.  Gynecologic History No LMP recorded. Patient is not currently having periods (Reason: IUD). Contraception: IUD Last Pap: 2012. Results were: normal Last mammogram: 2011. Results were: normal  Obstetric History OB History  Gravida Para Term Preterm AB SAB TAB Ectopic Multiple Living  4 2 2  0 1 1 0 0 0 1    # Outcome Date GA Lbr Len/2nd Weight Sex Delivery Anes PTL Lv  4 TRM 08/06/11 [redacted]w[redacted]d 04:33 / 00:03 6 lb 2.1 oz (2.781 kg) F SVD None    3 SAB           2 TRM           1 GRA                ROS: A ROS was performed and pertinent positives and negatives are included in the history.  GENERAL: No fevers or chills. HEENT: No change in vision, no earache, sore throat or sinus congestion. NECK: No pain or stiffness. CARDIOVASCULAR: No chest pain or pressure. No palpitations. PULMONARY: No shortness of breath, cough or wheeze. GASTROINTESTINAL: No abdominal pain, nausea, vomiting or diarrhea, melena or bright red blood per rectum. GENITOURINARY: No urinary frequency, urgency, hesitancy or dysuria. MUSCULOSKELETAL: No joint or muscle pain, no back pain, no recent trauma. DERMATOLOGIC: No rash, no itching, no lesions. ENDOCRINE: No polyuria, polydipsia, no heat or cold intolerance. No recent change in weight. HEMATOLOGICAL: No anemia or easy bruising or bleeding.  NEUROLOGIC: No headache, seizures, numbness, tingling or weakness. PSYCHIATRIC: No depression, no loss of interest in normal activity or change in sleep pattern.     Exam: chaperone present  BP 130/84  Ht 5' 1.75" (1.568 m)  Wt 131 lb 9.6 oz (59.693 kg)  BMI 24.28 kg/m2  Body mass index is 24.28 kg/(m^2).  General appearance : Well developed well nourished female. No acute distress HEENT: Neck supple, trachea midline, no carotid bruits, no thyroidmegaly Lungs: Clear to auscultation, no rhonchi or wheezes, or rib retractions  Heart: Regular rate and rhythm, no murmurs or gallops Breast:Examined in sitting and supine position were symmetrical in appearance, no palpable masses or tenderness,  no skin retraction, no nipple inversion, no nipple discharge, no skin discoloration, no axillary or supraclavicular lymphadenopathy Abdomen: no palpable masses or tenderness, no rebound or guarding Extremities: no edema or skin discoloration or tenderness  Pelvic:  Bartholin, Urethra, Skene Glands: Within normal limits             Vagina: No gross lesions or discharge  Cervix: No gross lesions or discharge, IUD string seen  Uterus  retroverted, normal size, shape and consistency, non-tender and mobile  Adnexa  Without masses or tenderness  Anus and perineum  normal   Rectovaginal  normal sphincter tone without palpated masses or tenderness             Hemoccult  not indicated     Assessment/Plan:  38 y.o. female for annual exam doing well. We discussed importance of calcium and vitamin D in regular exercise for osteoporosis prevention. The following labs were ordered today: Pap smear, CBC, screening cholesterol, TSH, comprehensive metabolic panel and urinalysis. We discussed also the importance of monthly self breast examination.  Note: This dictation was prepared with  Dragon/digital dictation along withSmart phrase technology. Any transcriptional errors that result from this process are  unintentional.   Ok EdwardsFERNANDEZ,JUAN H MD, 9:29 AM 12/06/2013

## 2013-12-07 ENCOUNTER — Other Ambulatory Visit: Payer: Self-pay | Admitting: Gynecology

## 2013-12-07 DIAGNOSIS — D582 Other hemoglobinopathies: Secondary | ICD-10-CM

## 2013-12-07 DIAGNOSIS — R718 Other abnormality of red blood cells: Secondary | ICD-10-CM

## 2013-12-07 LAB — URINALYSIS W MICROSCOPIC + REFLEX CULTURE
BILIRUBIN URINE: NEGATIVE
CASTS: NONE SEEN
CRYSTALS: NONE SEEN
GLUCOSE, UA: NEGATIVE mg/dL
HGB URINE DIPSTICK: NEGATIVE
KETONES UR: NEGATIVE mg/dL
Leukocytes, UA: NEGATIVE
NITRITE: NEGATIVE
PH: 7 (ref 5.0–8.0)
Protein, ur: NEGATIVE mg/dL
SPECIFIC GRAVITY, URINE: 1.012 (ref 1.005–1.030)
Urobilinogen, UA: 0.2 mg/dL (ref 0.0–1.0)

## 2013-12-07 LAB — TSH: TSH: 1.218 u[IU]/mL (ref 0.350–4.500)

## 2013-12-13 ENCOUNTER — Other Ambulatory Visit: Payer: 59

## 2014-01-27 ENCOUNTER — Other Ambulatory Visit: Payer: Self-pay | Admitting: Gynecology

## 2014-07-15 ENCOUNTER — Encounter: Payer: Self-pay | Admitting: Gynecology

## 2014-12-11 ENCOUNTER — Ambulatory Visit (INDEPENDENT_AMBULATORY_CARE_PROVIDER_SITE_OTHER): Payer: 59 | Admitting: Gynecology

## 2014-12-11 ENCOUNTER — Encounter: Payer: Self-pay | Admitting: Gynecology

## 2014-12-11 ENCOUNTER — Other Ambulatory Visit (HOSPITAL_COMMUNITY)
Admission: RE | Admit: 2014-12-11 | Discharge: 2014-12-11 | Disposition: A | Discharge status: Home / Self Care | Payer: 59 | Source: Ambulatory Visit | Attending: Gynecology | Admitting: Gynecology

## 2014-12-11 VITALS — BP 142/88 | Ht 61.75 in | Wt 133.0 lb

## 2014-12-11 DIAGNOSIS — Z01419 Encounter for gynecological examination (general) (routine) without abnormal findings: Secondary | ICD-10-CM | POA: Insufficient documentation

## 2014-12-11 DIAGNOSIS — F17210 Nicotine dependence, cigarettes, uncomplicated: Secondary | ICD-10-CM | POA: Diagnosis not present

## 2014-12-11 LAB — CBC WITH DIFFERENTIAL/PLATELET
BASOS ABS: 0 10*3/uL (ref 0.0–0.1)
Basophils Relative: 0 % (ref 0–1)
EOS PCT: 2 % (ref 0–5)
Eosinophils Absolute: 0.1 10*3/uL (ref 0.0–0.7)
HCT: 45.5 % (ref 36.0–46.0)
Hemoglobin: 15.6 g/dL — ABNORMAL HIGH (ref 12.0–15.0)
LYMPHS ABS: 1.8 10*3/uL (ref 0.7–4.0)
Lymphocytes Relative: 39 % (ref 12–46)
MCH: 30.5 pg (ref 26.0–34.0)
MCHC: 34.3 g/dL (ref 30.0–36.0)
MCV: 88.9 fL (ref 78.0–100.0)
MONO ABS: 0.5 10*3/uL (ref 0.1–1.0)
MPV: 10.6 fL (ref 8.6–12.4)
Monocytes Relative: 10 % (ref 3–12)
NEUTROS PCT: 49 % (ref 43–77)
Neutro Abs: 2.3 10*3/uL (ref 1.7–7.7)
PLATELETS: 156 10*3/uL (ref 150–400)
RBC: 5.12 MIL/uL — AB (ref 3.87–5.11)
RDW: 13.1 % (ref 11.5–15.5)
WBC: 4.6 10*3/uL (ref 4.0–10.5)

## 2014-12-11 LAB — COMPREHENSIVE METABOLIC PANEL
ALBUMIN: 4.9 g/dL (ref 3.5–5.2)
ALT: 9 U/L (ref 0–35)
AST: 12 U/L (ref 0–37)
Alkaline Phosphatase: 45 U/L (ref 39–117)
BILIRUBIN TOTAL: 1 mg/dL (ref 0.2–1.2)
BUN: 8 mg/dL (ref 6–23)
CO2: 26 meq/L (ref 19–32)
CREATININE: 0.77 mg/dL (ref 0.50–1.10)
Calcium: 9.8 mg/dL (ref 8.4–10.5)
Chloride: 103 mEq/L (ref 96–112)
Glucose, Bld: 89 mg/dL (ref 70–99)
POTASSIUM: 4.5 meq/L (ref 3.5–5.3)
Sodium: 136 mEq/L (ref 135–145)
Total Protein: 7.3 g/dL (ref 6.0–8.3)

## 2014-12-11 LAB — TSH: TSH: 1.246 u[IU]/mL (ref 0.350–4.500)

## 2014-12-11 LAB — CHOLESTEROL, TOTAL: CHOLESTEROL: 149 mg/dL (ref 0–200)

## 2014-12-11 NOTE — Patient Instructions (Signed)
Smoking Cessation Quitting smoking is important to your health and has many advantages. However, it is not always easy to quit since nicotine is a very addictive drug. Oftentimes, people try 3 times or more before being able to quit. This document explains the best ways for you to prepare to quit smoking. Quitting takes hard work and a lot of effort, but you can do it. ADVANTAGES OF QUITTING SMOKING  You will live longer, feel better, and live better.  Your body will feel the impact of quitting smoking almost immediately.  Within 20 minutes, blood pressure decreases. Your pulse returns to its normal level.  After 8 hours, carbon monoxide levels in the blood return to normal. Your oxygen level increases.  After 24 hours, the chance of having a heart attack starts to decrease. Your breath, hair, and body stop smelling like smoke.  After 48 hours, damaged nerve endings begin to recover. Your sense of taste and smell improve.  After 72 hours, the body is virtually free of nicotine. Your bronchial tubes relax and breathing becomes easier.  After 2 to 12 weeks, lungs can hold more air. Exercise becomes easier and circulation improves.  The risk of having a heart attack, stroke, cancer, or lung disease is greatly reduced.  After 1 year, the risk of coronary heart disease is cut in half.  After 5 years, the risk of stroke falls to the same as a nonsmoker.  After 10 years, the risk of lung cancer is cut in half and the risk of other cancers decreases significantly.  After 15 years, the risk of coronary heart disease drops, usually to the level of a nonsmoker.  If you are pregnant, quitting smoking will improve your chances of having a healthy baby.  The people you live with, especially any children, will be healthier.  You will have extra money to spend on things other than cigarettes. QUESTIONS TO THINK ABOUT BEFORE ATTEMPTING TO QUIT You may want to talk about your answers with your  health care provider.  Why do you want to quit?  If you tried to quit in the past, what helped and what did not?  What will be the most difficult situations for you after you quit? How will you plan to handle them?  Who can help you through the tough times? Your family? Friends? A health care provider?  What pleasures do you get from smoking? What ways can you still get pleasure if you quit? Here are some questions to ask your health care provider:  How can you help me to be successful at quitting?  What medicine do you think would be best for me and how should I take it?  What should I do if I need more help?  What is smoking withdrawal like? How can I get information on withdrawal? GET READY  Set a quit date.  Change your environment by getting rid of all cigarettes, ashtrays, matches, and lighters in your home, car, or work. Do not let people smoke in your home.  Review your past attempts to quit. Think about what worked and what did not. GET SUPPORT AND ENCOURAGEMENT You have a better chance of being successful if you have help. You can get support in many ways.  Tell your family, friends, and coworkers that you are going to quit and need their support. Ask them not to smoke around you.  Get individual, group, or telephone counseling and support. Programs are available at local hospitals and health centers. Call   your local health department for information about programs in your area.  Spiritual beliefs and practices may help some smokers quit.  Download a "quit meter" on your computer to keep track of quit statistics, such as how long you have gone without smoking, cigarettes not smoked, and money saved.  Get a self-help book about quitting smoking and staying off tobacco. LEARN NEW SKILLS AND BEHAVIORS  Distract yourself from urges to smoke. Talk to someone, go for a walk, or occupy your time with a task.  Change your normal routine. Take a different route to work.  Drink tea instead of coffee. Eat breakfast in a different place.  Reduce your stress. Take a hot bath, exercise, or read a book.  Plan something enjoyable to do every day. Reward yourself for not smoking.  Explore interactive web-based programs that specialize in helping you quit. GET MEDICINE AND USE IT CORRECTLY Medicines can help you stop smoking and decrease the urge to smoke. Combining medicine with the above behavioral methods and support can greatly increase your chances of successfully quitting smoking.  Nicotine replacement therapy helps deliver nicotine to your body without the negative effects and risks of smoking. Nicotine replacement therapy includes nicotine gum, lozenges, inhalers, nasal sprays, and skin patches. Some may be available over-the-counter and others require a prescription.  Antidepressant medicine helps people abstain from smoking, but how this works is unknown. This medicine is available by prescription.  Nicotinic receptor partial agonist medicine simulates the effect of nicotine in your brain. This medicine is available by prescription. Ask your health care provider for advice about which medicines to use and how to use them based on your health history. Your health care provider will tell you what side effects to look out for if you choose to be on a medicine or therapy. Carefully read the information on the package. Do not use any other product containing nicotine while using a nicotine replacement product.  RELAPSE OR DIFFICULT SITUATIONS Most relapses occur within the first 3 months after quitting. Do not be discouraged if you start smoking again. Remember, most people try several times before finally quitting. You may have symptoms of withdrawal because your body is used to nicotine. You may crave cigarettes, be irritable, feel very hungry, cough often, get headaches, or have difficulty concentrating. The withdrawal symptoms are only temporary. They are strongest  when you first quit, but they will go away within 10-14 days. To reduce the chances of relapse, try to:  Avoid drinking alcohol. Drinking lowers your chances of successfully quitting.  Reduce the amount of caffeine you consume. Once you quit smoking, the amount of caffeine in your body increases and can give you symptoms, such as a rapid heartbeat, sweating, and anxiety.  Avoid smokers because they can make you want to smoke.  Do not let weight gain distract you. Many smokers will gain weight when they quit, usually less than 10 pounds. Eat a healthy diet and stay active. You can always lose the weight gained after you quit.  Find ways to improve your mood other than smoking. FOR MORE INFORMATION  www.smokefree.gov  Document Released: 08/24/2001 Document Revised: 01/14/2014 Document Reviewed: 12/09/2011 [facility] Patient Information 2015 [place], [place]. This information is not intended to replace advice given to you by your health care provider. Make sure you discuss any questions you have with your health care provider. Breast Tenderness Breast tenderness is a common problem for women of all ages. Breast tenderness may cause mild discomfort to severe pain.  It has a variety of causes. Your health care provider will find out the likely cause of your breast tenderness by examining your breasts, asking you about symptoms, and ordering some tests. Breast tenderness usually does not mean you have breast cancer. HOME CARE INSTRUCTIONS  Breast tenderness often can be handled at home. You can try:  Getting fitted for a new bra that provides more support, especially during exercise.  Wearing a more supportive bra or sports bra while sleeping when your breasts are very tender.  If you have a breast injury, apply ice to the area:  Put ice in a plastic bag.  Place a towel between your skin and the bag.  Leave the ice on for 20 minutes, 2-3 times a day.  If your breasts are too full of milk as a  result of breastfeeding, try:  Expressing milk either by hand or with a breast pump.  Applying a warm compress to the breasts for relief.  Taking over-the-counter pain relievers, if approved by your health care provider.  Taking other medicines that your health care provider prescribes. These may include antibiotic medicines or birth control pills. Over the long term, your breast tenderness might be eased if you:  Cut down on caffeine.  Reduce the amount of fat in your diet. Keep a log of the days and times when your breasts are most tender. This will help you and your health care provider find the cause of the tenderness and how to relieve it. Also, learn how to do breast exams at home. This will help you notice if you have an unusual growth or lump that could cause tenderness. SEEK MEDICAL CARE IF:   Any part of your breast is hard, red, and hot to the touch. This could be a sign of infection.  Fluid is coming out of your nipples (and you are not breastfeeding). Especially watch for blood or pus.  You have a fever as well as breast tenderness.  You have a new or painful lump in your breast that remains after your menstrual period ends.  You have tried to take care of the pain at home, but it has not gone away.  Your breast pain is getting worse, or the pain is making it hard to do the things you usually do during your day. Document Released: 08/12/2008 Document Revised: 05/02/2013 Document Reviewed: 03/29/2013 [facility] Patient Information 2015 [place]. This information is not intended to replace advice given to you by your health care provider. Make sure you discuss any questions you have with your health care provider.

## 2014-12-11 NOTE — Progress Notes (Signed)
Betty Hall 22-Feb-1976 [id number]   History:    38 y.o.  for annual gyn exam with the only complaint being over the past month and a half of bilateral breast tenderness on and off. She had quit smoking many years ago has started smoking once again whereby she smoking half a pack cigarette per day as well as her partner. She originally had stop smoking back in 2011.Review of her record indicated she had a LEEP cervical conization 1999 for CIN-2 margins were negative. Subsequent Pap smears of been normal. She does her monthly self breast examination. She had a mammogram in 2011 whereby only fatty tissue a small intramammary lymph node was noted. Patient Mirena IUD placed in 2013. She reports normal menstrual cycles.  Past medical history,surgical history, family history and social history were all reviewed and documented in the EPIC chart.  Gynecologic History No LMP recorded. Patient is not currently having periods (Reason: IUD). Contraception: IUD Last Pap: 2015. Results were: normal Last mammogram 2011. Results were: normal  Obstetric History OB History  Gravida Para Term Preterm AB SAB TAB Ectopic Multiple Living  0 1 1 0 0 0 1    # Outcome Date GA Lbr Len/2nd Weight Sex Delivery Anes PTL Lv  4 Term 08/06/11 [redacted]w[redacted]d 04:33 / 00:03 6 lb 2.1 oz (2.781 kg) F Vag-Spont None    3 SAB           2 Term           1 Gravida                ROS: A ROS was performed and pertinent positives and negatives are included in the history.  GENERAL: No fevers or chills. HEENT: No change in vision, no earache, sore throat or sinus congestion. NECK: No pain or stiffness. CARDIOVASCULAR: No chest pain or pressure. No palpitations. PULMONARY: No shortness of breath, cough or wheeze. GASTROINTESTINAL: No abdominal pain, nausea, vomiting or diarrhea, melena or bright red blood per rectum. GENITOURINARY: No urinary frequency, urgency, hesitancy or dysuria. MUSCULOSKELETAL: No joint or muscle pain, no  back pain, no recent trauma. DERMATOLOGIC: No rash, no itching, no lesions. ENDOCRINE: No polyuria, polydipsia, no heat or cold intolerance. No recent change in weight. HEMATOLOGICAL: No anemia or easy bruising or bleeding. NEUROLOGIC: No headache, seizures, numbness, tingling or weakness. PSYCHIATRIC: No depression, no loss of interest in normal activity or change in sleep pattern.     Exam: chaperone present  BP 142/88 mmHg  Ht 5' 1.75" (1.568 m)  Wt 133 lb (60.328 kg)  BMI 24.54 kg/m2  Body mass index is 24.54 kg/(m^2).  General appearance : Well developed well nourished female. No acute distress HEENT: Eyes: no retinal hemorrhage or exudates,  Neck supple, trachea midline, no carotid bruits, no thyroidmegaly Lungs: Clear to auscultation, no rhonchi or wheezes, or rib retractions  Heart: Regular rate and rhythm, no murmurs or gallops Breast:Examined in sitting and supine position were symmetrical in appearance, no palpable masses or tenderness,  no skin retraction, no nipple inversion, no nipple discharge, no skin discoloration, no axillary or supraclavicular lymphadenopathy Abdomen: no palpable masses or tenderness, no rebound or guarding Extremities: no edema or skin discoloration or tenderness  Pelvic:  Bartholin, Urethra, Skene Glands: Within normal limits             Vagina: No gross lesions or discharge  Cervix: No gross lesions or discharge, IUD string seen  Uterus  anteverted, normal  size, shape and consistency, non-tender and mobile  Adnexa  Without masses or tenderness  Anus and perineum  normal   Rectovaginal  normal sphincter tone without palpated masses or tenderness             Hemoccult not indicated     Assessment/Plan:  39 y.o. female for annual exam who began having occasional bilateral mastodynia since she restarted smoking again. She smokes half a pack cigarette per day. She was counseled on the detrimental effects of smoking. She was offered treatment such as  Chantix but refused. Information will be provided on anti-smoking programs. She was reminded to do her monthly breast exam. She can take vitamin E6 100 units daily. The following screening labs were ordered: Screening cholesterol, compresses a metabolic panel, TSH, CBC and urinalysis. Pap smear was done today. We'll continue to do yearly Pap smears as a result of her past history of CIN-2 for close surveillance.   Ok EdwardsFERNANDEZ,JUAN H MD, 10:53 AM 12/11/2014

## 2014-12-12 LAB — URINALYSIS W MICROSCOPIC + REFLEX CULTURE
Bacteria, UA: NONE SEEN
Bilirubin Urine: NEGATIVE
Casts: NONE SEEN
Crystals: NONE SEEN
Glucose, UA: NEGATIVE mg/dL
Hgb urine dipstick: NEGATIVE
Ketones, ur: NEGATIVE mg/dL
Nitrite: NEGATIVE
Protein, ur: NEGATIVE mg/dL
Specific Gravity, Urine: 1.006 (ref 1.005–1.030)
Urobilinogen, UA: 0.2 mg/dL (ref 0.0–1.0)
pH: 6.5 (ref 5.0–8.0)

## 2014-12-12 LAB — CYTOLOGY - PAP

## 2014-12-13 LAB — URINE CULTURE

## 2015-03-08 ENCOUNTER — Other Ambulatory Visit: Payer: Self-pay | Admitting: Gynecology

## 2015-03-10 NOTE — Telephone Encounter (Signed)
Was seen march 2016

## 2015-09-19 ENCOUNTER — Other Ambulatory Visit: Payer: Self-pay | Admitting: Gynecology

## 2015-10-15 HISTORY — PX: KNEE SURGERY: SHX244

## 2015-12-25 ENCOUNTER — Encounter: Payer: 59 | Admitting: Gynecology

## 2016-01-22 ENCOUNTER — Encounter: Payer: Self-pay | Admitting: Gynecology

## 2016-01-22 ENCOUNTER — Ambulatory Visit (INDEPENDENT_AMBULATORY_CARE_PROVIDER_SITE_OTHER): Payer: 59 | Admitting: Gynecology

## 2016-01-22 VITALS — BP 124/80 | Ht 62.0 in | Wt 135.0 lb

## 2016-01-22 DIAGNOSIS — Z01419 Encounter for gynecological examination (general) (routine) without abnormal findings: Secondary | ICD-10-CM

## 2016-01-22 DIAGNOSIS — Z720 Tobacco use: Secondary | ICD-10-CM

## 2016-01-22 DIAGNOSIS — Z87410 Personal history of cervical dysplasia: Secondary | ICD-10-CM

## 2016-01-22 DIAGNOSIS — F17200 Nicotine dependence, unspecified, uncomplicated: Secondary | ICD-10-CM

## 2016-01-22 NOTE — Patient Instructions (Signed)
Smoking Cessation, Tips for Success If you are ready to quit smoking, congratulations! You have chosen to help yourself be healthier. Cigarettes bring nicotine, tar, carbon monoxide, and other irritants into your body. Your lungs, heart, and blood vessels will be able to work better without these poisons. There are many different ways to quit smoking. Nicotine gum, nicotine patches, a nicotine inhaler, or nicotine nasal spray can help with physical craving. Hypnosis, support groups, and medicines help break the habit of smoking. WHAT THINGS CAN I DO TO MAKE QUITTING EASIER?  Here are some tips to help you quit for good:  Pick a date when you will quit smoking completely. Tell all of your friends and family about your plan to quit on that date.  Do not try to slowly cut down on the number of cigarettes you are smoking. Pick a quit date and quit smoking completely starting on that day.  Throw away all cigarettes.   Clean and remove all ashtrays from your home, work, and car.  On a card, write down your reasons for quitting. Carry the card with you and read it when you get the urge to smoke.  Cleanse your body of nicotine. Drink enough water and fluids to keep your urine clear or pale yellow. Do this after quitting to flush the nicotine from your body.  Learn to predict your moods. Do not let a bad situation be your excuse to have a cigarette. Some situations in your life might tempt you into wanting a cigarette.  Never have "just one" cigarette. It leads to wanting another and another. Remind yourself of your decision to quit.  Change habits associated with smoking. If you smoked while driving or when feeling stressed, try other activities to replace smoking. Stand up when drinking your coffee. Brush your teeth after eating. Sit in a different chair when you read the paper. Avoid alcohol while trying to quit, and try to drink fewer caffeinated beverages. Alcohol and caffeine may urge you to  smoke.  Avoid foods and drinks that can trigger a desire to smoke, such as sugary or spicy foods and alcohol.  Ask people who smoke not to smoke around you.  Have something planned to do right after eating or having a cup of coffee. For example, plan to take a walk or exercise.  Try a relaxation exercise to calm you down and decrease your stress. Remember, you may be tense and nervous for the first 2 weeks after you quit, but this will pass.  Find new activities to keep your hands busy. Play with a pen, coin, or rubber band. Doodle or draw things on paper.  Brush your teeth right after eating. This will help cut down on the craving for the taste of tobacco after meals. You can also try mouthwash.   Use oral substitutes in place of cigarettes. Try using lemon drops, carrots, cinnamon sticks, or chewing gum. Keep them handy so they are available when you have the urge to smoke.  When you have the urge to smoke, try deep breathing.  Designate your home as a nonsmoking area.  If you are a heavy smoker, ask your health care provider about a prescription for nicotine chewing gum. It can ease your withdrawal from nicotine.  Reward yourself. Set aside the cigarette money you save and buy yourself something nice.  Look for support from others. Join a support group or smoking cessation program. Ask someone at home or at work to help you with your plan   to quit smoking.  Always ask yourself, "Do I need this cigarette or is this just a reflex?" Tell yourself, "Today, I choose not to smoke," or "I do not want to smoke." You are reminding yourself of your decision to quit.  Do not replace cigarette smoking with electronic cigarettes (commonly called e-cigarettes). The safety of e-cigarettes is unknown, and some may contain harmful chemicals.  If you relapse, do not give up! Plan ahead and think about what you will do the next time you get the urge to smoke. HOW WILL I FEEL WHEN I QUIT SMOKING? You  may have symptoms of withdrawal because your body is used to nicotine (the addictive substance in cigarettes). You may crave cigarettes, be irritable, feel very hungry, cough often, get headaches, or have difficulty concentrating. The withdrawal symptoms are only temporary. They are strongest when you first quit but will go away within 10-14 days. When withdrawal symptoms occur, stay in control. Think about your reasons for quitting. Remind yourself that these are signs that your body is healing and getting used to being without cigarettes. Remember that withdrawal symptoms are easier to treat than the major diseases that smoking can cause.  Even after the withdrawal is over, expect periodic urges to smoke. However, these cravings are generally short lived and will go away whether you smoke or not. Do not smoke! WHAT RESOURCES ARE AVAILABLE TO HELP ME QUIT SMOKING? Your health care provider can direct you to community resources or hospitals for support, which may include:  Group support.  Education.  Hypnosis.  Therapy.   This information is not intended to replace advice given to you by your health care provider. Make sure you discuss any questions you have with your health care provider.   Document Released: 05/28/2004 Document Revised: 09/20/2014 Document Reviewed: 02/15/2013 [employer] Interactive Patient Education 2016 [employer]  

## 2016-01-22 NOTE — Progress Notes (Signed)
Betty Hall [birth year] [id number]   History:    40 y.o.  for annual gyn exam with no complaints today. Patient has begun to smoke approximate half a pack per day. She originally had stop smoking back in 2011.Review of her record indicated she had a LEEP cervical conization 1999 for CIN-2 margins were negative. Subsequent Pap smears of been normal. She does her monthly self breast examination. She had a mammogram in 2011 whereby only fatty tissue a small intramammary lymph node was noted. Patient Mirena IUD placed in 2013. She reports normal menstrual cycles.  Past medical history,surgical history, family history and social history were all reviewed and documented in the EPIC chart.  Gynecologic History No LMP recorded. Patient is not currently having periods (Reason: IUD). Contraception: IUD Last Pap: 2016. Results were: normal Last mammogram: See above. Results were: See above  Obstetric History OB History  Gravida Para Term Preterm AB SAB TAB Ectopic Multiple Living  0 1 1 0 0 0 1    # Outcome Date GA Lbr Len/2nd Weight Sex Delivery Anes PTL Lv  4 Term 08/06/11 [redacted]w[redacted]d 04:33 / 00:03 6 lb 2.1 oz (2.781 kg) F Vag-Spont None    3 SAB           2 Term           1 Gravida                ROS: A ROS was performed and pertinent positives and negatives are included in the history.  GENERAL: No fevers or chills. HEENT: No change in vision, no earache, sore throat or sinus congestion. NECK: No pain or stiffness. CARDIOVASCULAR: No chest pain or pressure. No palpitations. PULMONARY: No shortness of breath, cough or wheeze. GASTROINTESTINAL: No abdominal pain, nausea, vomiting or diarrhea, melena or bright red blood per rectum. GENITOURINARY: No urinary frequency, urgency, hesitancy or dysuria. MUSCULOSKELETAL: No joint or muscle pain, no back pain, no recent trauma. DERMATOLOGIC: No rash, no itching, no lesions. ENDOCRINE: No polyuria, polydipsia, no heat or cold intolerance. No recent  change in weight. HEMATOLOGICAL: No anemia or easy bruising or bleeding. NEUROLOGIC: No headache, seizures, numbness, tingling or weakness. PSYCHIATRIC: No depression, no loss of interest in normal activity or change in sleep pattern.     Exam: chaperone present  BP 124/80 mmHg  Ht  (1.575 m)  Wt 135 lb (61.236 kg)  BMI 24.69 kg/m2  Body mass index is 24.69 kg/(m^2).  General appearance : Well developed well nourished female. No acute distress HEENT: Eyes: no retinal hemorrhage or exudates,  Neck supple, trachea midline, no carotid bruits, no thyroidmegaly Lungs: Clear to auscultation, no rhonchi or wheezes, or rib retractions  Heart: Regular rate and rhythm, no murmurs or gallops Breast:Examined in sitting and supine position were symmetrical in appearance, no palpable masses or tenderness,  no skin retraction, no nipple inversion, no nipple discharge, no skin discoloration, no axillary or supraclavicular lymphadenopathy Abdomen: no palpable masses or tenderness, no rebound or guarding Extremities: no edema or skin discoloration or tenderness  Pelvic:  Bartholin, Urethra, Skene Glands: Within normal limits             Vagina: No gross lesions or discharge  Cervix: No gross lesions or discharge, IUD string seen  Uterus  anteverted, normal size, shape and consistency, non-tender and mobile  Adnexa  Without masses or tenderness  Anus and perineum  normal   Rectovaginal  normal sphincter tone without  palpated masses or tenderness             Hemoccult not indicated     Assessment/Plan:  40 y.o. female for annual exam will return to the office next week in a fasting state for the following screening blood work: Comprehensive metabolic panel, fasting lipid profile, TSH, CBC, and urinalysis. H was reminded do her monthly breast examinations. Pap smear done today.   Ok EdwardsFERNANDEZ,JUAN H MD, 8:46 AM 01/22/2016

## 2016-01-23 LAB — URINALYSIS W MICROSCOPIC + REFLEX CULTURE
BILIRUBIN URINE: NEGATIVE
Casts: NONE SEEN [LPF]
Crystals: NONE SEEN [HPF]
GLUCOSE, UA: NEGATIVE
Hgb urine dipstick: NEGATIVE
Ketones, ur: NEGATIVE
NITRITE: NEGATIVE
PH: 6 (ref 5.0–8.0)
PROTEIN: NEGATIVE
Specific Gravity, Urine: 1.02 (ref 1.001–1.035)
Yeast: NONE SEEN [HPF]

## 2016-01-23 LAB — PAP IG W/ RFLX HPV ASCU

## 2016-01-24 LAB — URINE CULTURE
Colony Count: NO GROWTH
ORGANISM ID, BACTERIA: NO GROWTH

## 2016-01-29 ENCOUNTER — Telehealth: Payer: Self-pay | Admitting: *Deleted

## 2016-01-29 ENCOUNTER — Other Ambulatory Visit: Payer: 59

## 2016-01-29 LAB — COMPREHENSIVE METABOLIC PANEL
ALBUMIN: 4.6 g/dL (ref 3.6–5.1)
ALK PHOS: 42 U/L (ref 33–115)
ALT: 12 U/L (ref 6–29)
AST: 14 U/L (ref 10–30)
BUN: 8 mg/dL (ref 7–25)
CHLORIDE: 103 mmol/L (ref 98–110)
CO2: 27 mmol/L (ref 20–31)
CREATININE: 0.84 mg/dL (ref 0.50–1.10)
Calcium: 8.8 mg/dL (ref 8.6–10.2)
Glucose, Bld: 89 mg/dL (ref 65–99)
POTASSIUM: 4 mmol/L (ref 3.5–5.3)
SODIUM: 138 mmol/L (ref 135–146)
TOTAL PROTEIN: 6.6 g/dL (ref 6.1–8.1)
Total Bilirubin: 0.7 mg/dL (ref 0.2–1.2)

## 2016-01-29 LAB — CBC WITH DIFFERENTIAL/PLATELET
BASOS ABS: 0 {cells}/uL (ref 0–200)
Basophils Relative: 0 %
EOS ABS: 114 {cells}/uL (ref 15–500)
Eosinophils Relative: 3 %
HCT: 45.3 % — ABNORMAL HIGH (ref 35.0–45.0)
HEMOGLOBIN: 15.6 g/dL — AB (ref 11.7–15.5)
LYMPHS ABS: 1482 {cells}/uL (ref 850–3900)
Lymphocytes Relative: 39 %
MCH: 30.8 pg (ref 27.0–33.0)
MCHC: 34.4 g/dL (ref 32.0–36.0)
MCV: 89.5 fL (ref 80.0–100.0)
MPV: 10.5 fL (ref 7.5–12.5)
Monocytes Absolute: 342 cells/uL (ref 200–950)
Monocytes Relative: 9 %
NEUTROS ABS: 1862 {cells}/uL (ref 1500–7800)
Neutrophils Relative %: 49 %
Platelets: 158 10*3/uL (ref 140–400)
RBC: 5.06 MIL/uL (ref 3.80–5.10)
RDW: 13.1 % (ref 11.0–15.0)
WBC: 3.8 10*3/uL (ref 3.8–10.8)

## 2016-01-29 LAB — LIPID PANEL
CHOLESTEROL: 139 mg/dL (ref 125–200)
HDL: 42 mg/dL — ABNORMAL LOW (ref >=46)
LDL Cholesterol: 82 mg/dL (ref <130)
TRIGLYCERIDES: 77 mg/dL (ref <150)
Total CHOL/HDL Ratio: 3.3 Ratio (ref <=5.0)
VLDL: 15 mg/dL (ref <30)

## 2016-01-29 LAB — TSH: TSH: 1.09 mIU/L

## 2016-01-29 MED ORDER — PHENAZOPYRIDINE HCL 200 MG PO TABS: 200.0000 mg | ORAL_TABLET | Freq: Three times a day (TID) | ORAL | Status: DC | PRN

## 2016-01-29 MED ORDER — CIPROFLOXACIN HCL 250 MG PO TABS: 250.0000 mg | ORAL_TABLET | Freq: Two times a day (BID) | ORAL | Status: DC

## 2016-01-29 NOTE — Telephone Encounter (Signed)
We can call her Cipro 250 mg twice a day for 3 days along with Pyridium 200 mg one tablet 3 times a day for 3 days to help with her discomfort. Have her continue her fluids. If she has no improvement after these 3 days she should return to the office for a full urinalysis and culture to be repeated

## 2016-01-29 NOTE — Telephone Encounter (Signed)
Pt informed, Rx sent. 

## 2016-01-29 NOTE — Telephone Encounter (Signed)
Pt was seen on OV 01/22/16 for annual on Monday noted some lower back discomfort, slight urgency, pt urine culture showed "no growth" . She has increased her fluid intake, drink cranberry juice. Pt is at work today and unable to leave. Recommendations? Please advise

## 2016-04-29 ENCOUNTER — Other Ambulatory Visit: Payer: Self-pay | Admitting: Gynecology

## 2016-04-30 MED ORDER — FLUCONAZOLE 100 MG PO TABS: 100.0000 mg | ORAL_TABLET | Freq: Once | ORAL | 0 refills | Status: AC

## 2016-04-30 NOTE — Telephone Encounter (Signed)
Was prescribed augmentin by pcp last Friday for upper respiratory infection and now showing symptoms of yeast infection. PLEASE ADVISE

## 2016-09-14 DIAGNOSIS — M7061 Trochanteric bursitis, right hip: Secondary | ICD-10-CM | POA: Diagnosis not present

## 2016-09-14 DIAGNOSIS — M7631 Iliotibial band syndrome, right leg: Secondary | ICD-10-CM | POA: Diagnosis not present

## 2016-09-14 DIAGNOSIS — M25551 Pain in right hip: Secondary | ICD-10-CM | POA: Diagnosis not present

## 2016-09-14 DIAGNOSIS — M76891 Other specified enthesopathies of right lower limb, excluding foot: Secondary | ICD-10-CM | POA: Diagnosis not present

## 2016-09-15 ENCOUNTER — Telehealth: Payer: Self-pay | Admitting: *Deleted

## 2016-09-15 NOTE — Telephone Encounter (Signed)
Pt called stating she had a MRI with orthopedic, it noted "twisted Mirena IUD" and radiologist suggested pelvic ultrasound. I asked patient to have report faxed to me so I can give to Dr.Fernandez to review and approve. Pt will call me back to follow regarding fax.

## 2016-09-24 ENCOUNTER — Encounter: Payer: Self-pay | Admitting: Anesthesiology

## 2016-09-24 ENCOUNTER — Ambulatory Visit (INDEPENDENT_AMBULATORY_CARE_PROVIDER_SITE_OTHER): Payer: 59 | Admitting: Gynecology

## 2016-09-24 ENCOUNTER — Encounter: Payer: Self-pay | Admitting: Gynecology

## 2016-09-24 VITALS — BP 128/80 | Ht 62.0 in | Wt 135.0 lb

## 2016-09-24 DIAGNOSIS — Z975 Presence of (intrauterine) contraceptive device: Secondary | ICD-10-CM | POA: Insufficient documentation

## 2016-09-24 DIAGNOSIS — Z30433 Encounter for removal and reinsertion of intrauterine contraceptive device: Secondary | ICD-10-CM | POA: Diagnosis not present

## 2016-09-24 NOTE — Patient Instructions (Signed)

## 2016-09-24 NOTE — Progress Notes (Signed)
   Patient is a 41 year old who is here to remove her expired Mirena IUD that was placed in 2013 and requesting replacement with a new one. She has had good success.                                                                    IUD procedure note       Patient presented to the office today for placement of Mirena IUD. The patient had previously been provided with literature information on this method of contraception. The risks benefits and pros and cons were discussed and all her questions were answered. She is fully aware that this form of contraception is 99% effective and is good for 5 years.  Pelvic exam: Bartholin urethra Skene glands: Within normal limits Vagina: No lesions or discharge Cervix: No lesions or discharge, IUD string was visualized Betadine solution was placed on the cervix and with the use of a [place] clamp the IUD string was grasped retrieved shown to the patient and discarded. Uterus: Retroverted position Adnexa: No masses or tenderness Rectal exam: Not done  The cervix was cleansed with Betadine solution. A single-tooth tenaculum was placed on the anterior cervical lip. The cervical canal required dilatation in an effort to facilitate insertion of the IUD applicator. The uterus sounded to 7 centimeter. The IUD was shown to the patient and inserted in a sterile fashion. The IUD string was trimmed. The single-tooth tenaculum was removed. Patient was instructed to return back to the office in one month for follow up.        Lot number [id number]

## 2016-09-25 ENCOUNTER — Telehealth: Payer: Self-pay | Admitting: *Deleted

## 2016-09-25 NOTE — Telephone Encounter (Signed)
[phone][facility][id number] Mirena removal and reinsertion covered 100% KW CMA

## 2017-01-26 ENCOUNTER — Encounter: Payer: Self-pay | Admitting: Gynecology

## 2017-01-27 ENCOUNTER — Telehealth: Payer: Self-pay | Admitting: *Deleted

## 2017-01-27 DIAGNOSIS — Z13220 Encounter for screening for lipoid disorders: Secondary | ICD-10-CM

## 2017-01-27 DIAGNOSIS — Z01419 Encounter for gynecological examination (general) (routine) without abnormal findings: Secondary | ICD-10-CM

## 2017-01-27 DIAGNOSIS — Z1329 Encounter for screening for other suspected endocrine disorder: Secondary | ICD-10-CM

## 2017-01-27 NOTE — Telephone Encounter (Signed)
CBC, fasting lipid profile, comprehensive metabolic panel, TSH and urinalysis if she does not have [employer] and [employer] if not I cannot order the TSH and urinalysis

## 2017-01-27 NOTE — Telephone Encounter (Signed)
Pt has annual scheduled tomorrow wants to come in early am to have labs done. Please advise

## 2017-01-27 NOTE — Telephone Encounter (Signed)
Orders placed, pt will call to schedule lab appointment

## 2017-01-28 ENCOUNTER — Ambulatory Visit (INDEPENDENT_AMBULATORY_CARE_PROVIDER_SITE_OTHER): Payer: 59 | Admitting: Gynecology

## 2017-01-28 ENCOUNTER — Encounter: Payer: Self-pay | Admitting: Gynecology

## 2017-01-28 ENCOUNTER — Other Ambulatory Visit: Payer: 59

## 2017-01-28 VITALS — BP 126/80 | Ht 62.0 in | Wt 138.0 lb

## 2017-01-28 DIAGNOSIS — Z01419 Encounter for gynecological examination (general) (routine) without abnormal findings: Secondary | ICD-10-CM | POA: Diagnosis not present

## 2017-01-28 DIAGNOSIS — F17200 Nicotine dependence, unspecified, uncomplicated: Secondary | ICD-10-CM | POA: Diagnosis not present

## 2017-01-28 DIAGNOSIS — Z87410 Personal history of cervical dysplasia: Secondary | ICD-10-CM

## 2017-01-28 DIAGNOSIS — Z1329 Encounter for screening for other suspected endocrine disorder: Secondary | ICD-10-CM

## 2017-01-28 DIAGNOSIS — Z13220 Encounter for screening for lipoid disorders: Secondary | ICD-10-CM

## 2017-01-28 LAB — URINALYSIS W MICROSCOPIC + REFLEX CULTURE
Bacteria, UA: NONE SEEN [HPF]
Bilirubin Urine: NEGATIVE
CASTS: NONE SEEN [LPF]
Crystals: NONE SEEN [HPF]
Glucose, UA: NEGATIVE
Hgb urine dipstick: NEGATIVE
Ketones, ur: NEGATIVE
Leukocytes, UA: NEGATIVE
NITRITE: NEGATIVE
PH: 7 (ref 5.0–8.0)
Protein, ur: NEGATIVE
SPECIFIC GRAVITY, URINE: 1.003 (ref 1.001–1.035)
YEAST: NONE SEEN [HPF]

## 2017-01-28 LAB — LIPID PANEL
CHOL/HDL RATIO: 3.5 ratio (ref <5.0)
Cholesterol: 150 mg/dL (ref <200)
HDL: 43 mg/dL — AB (ref >50)
LDL Cholesterol: 88 mg/dL (ref <100)
Triglycerides: 95 mg/dL (ref <150)
VLDL: 19 mg/dL (ref <30)

## 2017-01-28 LAB — COMPREHENSIVE METABOLIC PANEL
ALT: 11 U/L (ref 6–29)
AST: 12 U/L (ref 10–30)
Albumin: 4.7 g/dL (ref 3.6–5.1)
Alkaline Phosphatase: 47 U/L (ref 33–115)
BUN: 9 mg/dL (ref 7–25)
CALCIUM: 9.4 mg/dL (ref 8.6–10.2)
CO2: 26 mmol/L (ref 20–31)
Chloride: 103 mmol/L (ref 98–110)
Creat: 0.77 mg/dL (ref 0.50–1.10)
GLUCOSE: 99 mg/dL (ref 65–99)
Potassium: 4.2 mmol/L (ref 3.5–5.3)
Sodium: 137 mmol/L (ref 135–146)
Total Bilirubin: 0.8 mg/dL (ref 0.2–1.2)
Total Protein: 6.9 g/dL (ref 6.1–8.1)

## 2017-01-28 LAB — CBC
HEMATOCRIT: 46 % — AB (ref 35.0–45.0)
Hemoglobin: 15.6 g/dL — ABNORMAL HIGH (ref 11.7–15.5)
MCH: 31 pg (ref 27.0–33.0)
MCHC: 33.9 g/dL (ref 32.0–36.0)
MCV: 91.3 fL (ref 80.0–100.0)
MPV: 10.5 fL (ref 7.5–12.5)
Platelets: 173 10*3/uL (ref 140–400)
RBC: 5.04 MIL/uL (ref 3.80–5.10)
RDW: 13.3 % (ref 11.0–15.0)
WBC: 4.9 10*3/uL (ref 3.8–10.8)

## 2017-01-28 NOTE — Addendum Note (Signed)
Addended by: Kem ParkinsonBARNES, Sharone Almond on: 01/28/2017 04:06 PM   Modules accepted: Orders

## 2017-01-28 NOTE — Patient Instructions (Signed)
Steps to Quit Smoking Smoking tobacco can be harmful to your health and can affect almost every organ in your body. Smoking puts you, and those around you, at risk for developing many serious chronic diseases. Quitting smoking is difficult, but it is one of the best things that you can do for your health. It is never too late to quit. What are the benefits of quitting smoking? When you quit smoking, you lower your risk of developing serious diseases and conditions, such as:  Lung cancer or lung disease, such as COPD.  Heart disease.  Stroke.  Heart attack.  Infertility.  Osteoporosis and bone fractures.  Additionally, symptoms such as coughing, wheezing, and shortness of breath may get better when you quit. You may also find that you get sick less often because your body is stronger at fighting off colds and infections. If you are pregnant, quitting smoking can help to reduce your chances of having a baby of low birth weight. How do I get ready to quit? When you decide to quit smoking, create a plan to make sure that you are successful. Before you quit:  Pick a date to quit. Set a date within the next two weeks to give you time to prepare.  Write down the reasons why you are quitting. Keep this list in places where you will see it often, such as on your bathroom mirror or in your car or wallet.  Identify the people, places, things, and activities that make you want to smoke (triggers) and avoid them. Make sure to take these actions: ? Throw away all cigarettes at home, at work, and in your car. ? Throw away smoking accessories, such as ashtrays and lighters. ? Clean your car and make sure to empty the ashtray. ? Clean your home, including curtains and carpets.  Tell your family, friends, and coworkers that you are quitting. Support from your loved ones can make quitting easier.  Talk with your health care provider about your options for quitting smoking.  Find out what treatment  options are covered by your health insurance.  What strategies can I use to quit smoking? Talk with your healthcare provider about different strategies to quit smoking. Some strategies include:  Quitting smoking altogether instead of gradually lessening how much you smoke over a period of time. Research shows that quitting "cold turkey" is more successful than gradually quitting.  Attending in-person counseling to help you build problem-solving skills. You are more likely to have success in quitting if you attend several counseling sessions. Even short sessions of 10 minutes can be effective.  Finding resources and support systems that can help you to quit smoking and remain smoke-free after you quit. These resources are most helpful when you use them often. They can include: ? Online chats with a counselor. ? Telephone quitlines. ? Printed self-help materials. ? Support groups or group counseling. ? Text messaging programs. ? Mobile phone applications.  Taking medicines to help you quit smoking. (If you are pregnant or breastfeeding, talk with your health care provider first.) Some medicines contain nicotine and some do not. Both types of medicines help with cravings, but the medicines that include nicotine help to relieve withdrawal symptoms. Your health care provider may recommend: ? Nicotine patches, gum, or lozenges. ? Nicotine inhalers or sprays. ? Non-nicotine medicine that is taken by mouth.  Talk with your health care provider about combining strategies, such as taking medicines while you are also receiving in-person counseling. Using these two strategies together   makes you more likely to succeed in quitting than if you used either strategy on its own. If you are pregnant or breastfeeding, talk with your health care provider about finding counseling or other support strategies to quit smoking. Do not take medicine to help you quit smoking unless told to do so by your health care  provider. What things can I do to make it easier to quit? Quitting smoking might feel overwhelming at first, but there is a lot that you can do to make it easier. Take these important actions:  Reach out to your family and friends and ask that they support and encourage you during this time. Call telephone quitlines, reach out to support groups, or work with a counselor for support.  Ask people who smoke to avoid smoking around you.  Avoid places that trigger you to smoke, such as bars, parties, or smoke-break areas at work.  Spend time around people who do not smoke.  Lessen stress in your life, because stress can be a smoking trigger for some people. To lessen stress, try: ? Exercising regularly. ? Deep-breathing exercises. ? Yoga. ? Meditating. ? Performing a body scan. This involves closing your eyes, scanning your body from head to toe, and noticing which parts of your body are particularly tense. Purposefully relax the muscles in those areas.  Download or purchase mobile phone or tablet apps (applications) that can help you stick to your quit plan by providing reminders, tips, and encouragement. There are many free apps, such as QuitGuide from the CDC (Centers for Disease Control and Prevention). You can find other support for quitting smoking (smoking cessation) through smokefree.gov and other websites.  How will I feel when I quit smoking? Within the first 24 hours of quitting smoking, you may start to feel some withdrawal symptoms. These symptoms are usually most noticeable 2-3 days after quitting, but they usually do not last beyond 2-3 weeks. Changes or symptoms that you might experience include:  Mood swings.  Restlessness, anxiety, or irritation.  Difficulty concentrating.  Dizziness.  Strong cravings for sugary foods in addition to nicotine.  Mild weight gain.  Constipation.  Nausea.  Coughing or a sore throat.  Changes in how your medicines work in your  body.  A depressed mood.  Difficulty sleeping (insomnia).  After the first 2-3 weeks of quitting, you may start to notice more positive results, such as:  Improved sense of smell and taste.  Decreased coughing and sore throat.  Slower heart rate.  Lower blood pressure.  Clearer skin.  The ability to breathe more easily.  Fewer sick days.  Quitting smoking is very challenging for most people. Do not get discouraged if you are not successful the first time. Some people need to make many attempts to quit before they achieve long-term success. Do your best to stick to your quit plan, and talk with your health care provider if you have any questions or concerns. This information is not intended to replace advice given to you by your health care provider. Make sure you discuss any questions you have with your health care provider. Document Released: 08/24/2001 Document Revised: 04/27/2016 Document Reviewed: 01/14/2015 Elsevier Interactive Patient Education  2017 Elsevier Inc.  

## 2017-01-28 NOTE — Progress Notes (Signed)
Betty Hall 1975-11-08 [id number]   History:    41 y.o.  for annual gyn exam with no complaints except thinning of her hair at times. She has not noticed any bald spots. Patient is return back to smoking despite being counseled in the past. She is smoking 1 pack cigarette per day. She had stop smoking back in 2011 but has restarted again. Review of her record indicated that she had a LEEP cervical conization 1999 for CIN-2 margins were negative. Subsequent Pap smears of been normal. She does her monthly self breast examination. She had a mammogram in 2011 whereby only fatty tissue a small intramammary lymph node was noted. Patient had a Mirena IUD replaced in January 2018 and has done well.  Past medical history,surgical history, family history and social history were all reviewed and documented in the EPIC chart.  Gynecologic History No LMP recorded. Patient is not currently having periods (Reason: IUD). Contraception: IUD Last Pap: 2015 2016 2017 normal. Results were: normal Last mammogram: See above. Results were: See above  Obstetric History OB History  Gravida Para Term Preterm AB Living  4 2 2  0 1 1  SAB TAB Ectopic Multiple Live Births  1 0 0 0      # Outcome Date GA Lbr Len/2nd Weight Sex Delivery Anes PTL Lv  4 Term 08/06/11 [redacted]w[redacted]d 04:33 / 00:03 6 lb 2.1 oz (2.781 kg) F Vag-Spont None    3 SAB           2 Term           1 Gravida                ROS: A ROS was performed and pertinent positives and negatives are included in the history.  GENERAL: No fevers or chills. HEENT: No change in vision, no earache, sore throat or sinus congestion. NECK: No pain or stiffness. CARDIOVASCULAR: No chest pain or pressure. No palpitations. PULMONARY: No shortness of breath, cough or wheeze. GASTROINTESTINAL: No abdominal pain, nausea, vomiting or diarrhea, melena or bright red blood per rectum. GENITOURINARY: No urinary frequency, urgency, hesitancy or dysuria. MUSCULOSKELETAL: No joint  or muscle pain, no back pain, no recent trauma. DERMATOLOGIC: No rash, no itching, no lesions. ENDOCRINE: No polyuria, polydipsia, no heat or cold intolerance. No recent change in weight. HEMATOLOGICAL: No anemia or easy bruising or bleeding. NEUROLOGIC: No headache, seizures, numbness, tingling or weakness. PSYCHIATRIC: No depression, no loss of interest in normal activity or change in sleep pattern.     Exam: chaperone present  BP 126/80   Ht 5\' 2"  (1.575 m)   Wt 138 lb (62.6 kg)   BMI 25.24 kg/m   Body mass index is 25.24 kg/m.  General appearance : Well developed well nourished female. No acute distress HEENT: Eyes: no retinal hemorrhage or exudates,  Neck supple, trachea midline, no carotid bruits, no thyroidmegaly Lungs: Clear to auscultation, no rhonchi or wheezes, or rib retractions  Heart: Regular rate and rhythm, no murmurs or gallops Breast:Examined in sitting and supine position were symmetrical in appearance, no palpable masses or tenderness,  no skin retraction, no nipple inversion, no nipple discharge, no skin discoloration, no axillary or supraclavicular lymphadenopathy Abdomen: no palpable masses or tenderness, no rebound or guarding Extremities: no edema or skin discoloration or tenderness  Pelvic:  Bartholin, Urethra, Skene Glands: Within normal limits             Vagina: No gross lesions or discharge  Cervix:  No gross lesions or discharge, IUD string visualized  Uterus  slightly retroverted, normal size, shape and consistency, non-tender and mobile  Adnexa  Without masses or tenderness  Anus and perineum  normal   Rectovaginal  normal sphincter tone without palpated masses or tenderness             Hemoccult not indicated     Assessment/Plan:  41 y.o. female for annual exam had her morning blood work consisting of CBC, comprehensive metabolic panel, fasting lipid profile and urinalysis. Because of her thinning of her hair we checked her TSH result pending at  time of this dictation. Patient with past history of CIN-2 we'll continue to have Pap smears every year. Pap smear done today. Patient will need her mammogram next year. She was counseled once again on the detrimental effects of smoking anti-smoking cessation literature was provided.   Ok EdwardsFERNANDEZ,Betty Mcmillon H MD, 3:55 PM 01/28/2017

## 2017-01-29 LAB — TSH: TSH: 1.32 m[IU]/L

## 2017-01-29 LAB — URINE CULTURE: Organism ID, Bacteria: NO GROWTH

## 2017-01-31 LAB — PAP IG W/ RFLX HPV ASCU

## 2017-04-20 DIAGNOSIS — H00021 Hordeolum internum right upper eyelid: Secondary | ICD-10-CM | POA: Diagnosis not present

## 2017-04-29 ENCOUNTER — Other Ambulatory Visit: Payer: Self-pay | Admitting: *Deleted

## 2017-04-29 MED ORDER — FLUCONAZOLE 150 MG PO TABS: 150.0000 mg | ORAL_TABLET | Freq: Once | ORAL | 0 refills | Status: AC

## 2017-04-29 NOTE — Telephone Encounter (Signed)
Pt has annual scheduled with you in 2019

## 2017-04-29 NOTE — Telephone Encounter (Signed)
Agree with refill x 3.

## 2017-05-11 DIAGNOSIS — J189 Pneumonia, unspecified organism: Secondary | ICD-10-CM | POA: Diagnosis not present

## 2017-05-11 DIAGNOSIS — J209 Acute bronchitis, unspecified: Secondary | ICD-10-CM | POA: Diagnosis not present

## 2017-08-29 ENCOUNTER — Other Ambulatory Visit: Payer: Self-pay

## 2017-08-29 NOTE — Telephone Encounter (Signed)
Former patient of Dr [employer]JF's. Has CE scheduled 02/02/17 at 9am with you.

## 2017-09-01 MED ORDER — FLUCONAZOLE 150 MG PO TABS: 150.0000 mg | ORAL_TABLET | Freq: Once | ORAL | 2 refills | Status: AC

## 2017-12-19 DIAGNOSIS — J189 Pneumonia, unspecified organism: Secondary | ICD-10-CM | POA: Diagnosis not present

## 2018-02-02 ENCOUNTER — Encounter: Payer: 59 | Admitting: Obstetrics & Gynecology

## 2018-04-26 ENCOUNTER — Telehealth: Payer: Self-pay | Admitting: *Deleted

## 2018-04-26 DIAGNOSIS — Z1321 Encounter for screening for nutritional disorder: Secondary | ICD-10-CM

## 2018-04-26 DIAGNOSIS — Z01419 Encounter for gynecological examination (general) (routine) without abnormal findings: Secondary | ICD-10-CM

## 2018-04-26 DIAGNOSIS — Z1329 Encounter for screening for other suspected endocrine disorder: Secondary | ICD-10-CM

## 2018-04-26 DIAGNOSIS — Z13220 Encounter for screening for lipoid disorders: Secondary | ICD-10-CM

## 2018-04-26 NOTE — Telephone Encounter (Signed)
Patient has annual exam scheduled on 05/02/18, would like to come on 04/28/18 to have labs done prior. Please advise

## 2018-04-27 NOTE — Telephone Encounter (Signed)
Agree with [facility] Labs:  CBC, CMP, TSH, FLP, Vit D.

## 2018-04-28 ENCOUNTER — Other Ambulatory Visit: Payer: 59

## 2018-04-28 DIAGNOSIS — Z13220 Encounter for screening for lipoid disorders: Secondary | ICD-10-CM

## 2018-04-28 DIAGNOSIS — Z1329 Encounter for screening for other suspected endocrine disorder: Secondary | ICD-10-CM

## 2018-04-28 DIAGNOSIS — Z1321 Encounter for screening for nutritional disorder: Secondary | ICD-10-CM

## 2018-04-28 DIAGNOSIS — Z01419 Encounter for gynecological examination (general) (routine) without abnormal findings: Secondary | ICD-10-CM

## 2018-04-28 NOTE — Telephone Encounter (Signed)
Patient aware, orders placed.  

## 2018-04-29 LAB — COMPREHENSIVE METABOLIC PANEL
AG Ratio: 2 (calc) (ref 1.0–2.5)
ALT: 14 U/L (ref 6–29)
AST: 17 U/L (ref 10–30)
Albumin: 4.7 g/dL (ref 3.6–5.1)
Alkaline phosphatase (APISO): 48 U/L (ref 33–115)
BILIRUBIN TOTAL: 0.8 mg/dL (ref 0.2–1.2)
BUN: 11 mg/dL (ref 7–25)
CALCIUM: 9.7 mg/dL (ref 8.6–10.2)
CO2: 24 mmol/L (ref 20–32)
Chloride: 102 mmol/L (ref 98–110)
Creat: 0.78 mg/dL (ref 0.50–1.10)
GLUCOSE: 87 mg/dL (ref 65–99)
Globulin: 2.4 g/dL (calc) (ref 1.9–3.7)
Potassium: 4.5 mmol/L (ref 3.5–5.3)
SODIUM: 137 mmol/L (ref 135–146)
TOTAL PROTEIN: 7.1 g/dL (ref 6.1–8.1)

## 2018-04-29 LAB — LIPID PANEL
Cholesterol: 175 mg/dL (ref <200)
HDL: 53 mg/dL (ref >50)
LDL Cholesterol (Calc): 104 mg/dL (calc) — ABNORMAL HIGH
NON-HDL CHOLESTEROL (CALC): 122 mg/dL (ref <130)
Total CHOL/HDL Ratio: 3.3 (calc) (ref <5.0)
Triglycerides: 87 mg/dL (ref <150)

## 2018-04-29 LAB — CBC
HEMATOCRIT: 46.2 % — AB (ref 35.0–45.0)
Hemoglobin: 15.9 g/dL — ABNORMAL HIGH (ref 11.7–15.5)
MCH: 30.9 pg (ref 27.0–33.0)
MCHC: 34.4 g/dL (ref 32.0–36.0)
MCV: 89.7 fL (ref 80.0–100.0)
MPV: 11.1 fL (ref 7.5–12.5)
PLATELETS: 164 10*3/uL (ref 140–400)
RBC: 5.15 10*6/uL — ABNORMAL HIGH (ref 3.80–5.10)
RDW: 12.4 % (ref 11.0–15.0)
WBC: 6.2 10*3/uL (ref 3.8–10.8)

## 2018-04-29 LAB — TSH: TSH: 1.47 mIU/L

## 2018-04-29 LAB — VITAMIN D 25 HYDROXY (VIT D DEFICIENCY, FRACTURES): Vit D, 25-Hydroxy: 42 ng/mL (ref 30–100)

## 2018-05-02 ENCOUNTER — Encounter: Payer: Self-pay | Admitting: Obstetrics & Gynecology

## 2018-05-02 ENCOUNTER — Ambulatory Visit (INDEPENDENT_AMBULATORY_CARE_PROVIDER_SITE_OTHER): Payer: 59 | Admitting: Obstetrics & Gynecology

## 2018-05-02 VITALS — BP 132/84 | Ht 61.75 in | Wt 124.0 lb

## 2018-05-02 DIAGNOSIS — Z720 Tobacco use: Secondary | ICD-10-CM

## 2018-05-02 DIAGNOSIS — Z01419 Encounter for gynecological examination (general) (routine) without abnormal findings: Secondary | ICD-10-CM

## 2018-05-02 DIAGNOSIS — Z30431 Encounter for routine checking of intrauterine contraceptive device: Secondary | ICD-10-CM

## 2018-05-02 NOTE — Progress Notes (Signed)
Fonnie Jarviseresa M Seefeldt March 30, 1976 [id number]   History:    42 y.o. G4P2A2L2  Daughters 41 yo and 307 yo.  [place] 42 yo.  RP:  Established patient presenting for annual gyn exam   HPI: Well on Mirena IUD x 09/2016.  No BTB.  No pelvic pain. H/O LEEP CIN2 in 1999, margins neg.  Paps normal since then.  No pain with IC.  Urine/BMs wnl.  Breasts normal.  BMI 22.86.  [facility] labs done here 04/28/2018, all wnl.   Past medical history,surgical history, family history and social history were all reviewed and documented in the EPIC chart.  Gynecologic History No LMP recorded. (Menstrual status: IUD). Contraception: Mirena IUD x 09/2016 Last Pap: 01/2017. Results were: Negative Last mammogram: [facility] Bone Density: Never Colonoscopy: Never  Obstetric History OB History  Gravida Para Term Preterm AB Living  4 2 2  0 1 1  SAB TAB Ectopic Multiple Live Births  1 0 0 0      # Outcome Date GA Lbr Len/2nd Weight Sex Delivery Anes PTL Lv  4 Term 08/06/11 1927w5d 04:33 / 00:03 6 lb 2.1 oz (2.781 kg) F Vag-Spont None    3 SAB           2 Term           1 Gravida              ROS: A ROS was performed and pertinent positives and negatives are included in the history.  GENERAL: No fevers or chills. HEENT: No change in vision, no earache, sore throat or sinus congestion. NECK: No pain or stiffness. CARDIOVASCULAR: No chest pain or pressure. No palpitations. PULMONARY: No shortness of breath, cough or wheeze. GASTROINTESTINAL: No abdominal pain, nausea, vomiting or diarrhea, melena or bright red blood per rectum. GENITOURINARY: No urinary frequency, urgency, hesitancy or dysuria. MUSCULOSKELETAL: No joint or muscle pain, no back pain, no recent trauma. DERMATOLOGIC: No rash, no itching, no lesions. ENDOCRINE: No polyuria, polydipsia, no heat or cold intolerance. No recent change in weight. HEMATOLOGICAL: No anemia or easy bruising or bleeding. NEUROLOGIC: No headache, seizures,  numbness, tingling or weakness. PSYCHIATRIC: No depression, no loss of interest in normal activity or change in sleep pattern.     Exam:   BP (!) 150/90   Ht 5' 1.75" (1.568 m)   Wt 124 lb (56.2 kg)   BMI 22.86 kg/m   Body mass index is 22.86 kg/m.  General appearance : Well developed well nourished female. No acute distress HEENT: Eyes: no retinal hemorrhage or exudates,  Neck supple, trachea midline, no carotid bruits, no thyroidmegaly Lungs: Clear to auscultation, no rhonchi or wheezes, or rib retractions  Heart: Regular rate and rhythm, no murmurs or gallops Breast:Examined in sitting and supine position were symmetrical in appearance, no palpable masses or tenderness,  no skin retraction, no nipple inversion, no nipple discharge, no skin discoloration, no axillary or supraclavicular lymphadenopathy Abdomen: no palpable masses or tenderness, no rebound or guarding Extremities: no edema or skin discoloration or tenderness  Pelvic: Vulva: Normal             Vagina: No gross lesions or discharge  Cervix: No gross lesions or discharge.  IUD strings seen.  Pap reflex done.  Uterus  AV, normal size, shape and consistency, non-tender and mobile  Adnexa  Without masses or tenderness  Anus: Normal   Assessment/Plan:  42 y.o. female for annual exam  1. Encounter for gynecological examination with Papanicolaou smear of cervix Normal gynecologic exam.  Pap reflex done.  Breast exam normal.  [facility]  [facility] labs wnl 04/2018.  Continue with fitness and healthy nutrition. - Pap IG w/ reflex to HPV when ASC-U  2. Encounter for routine checking of intrauterine contraceptive device (IUD) Mirena IUD in good location, well tolerated.  3. Smoking trying to quit Cigarette smoking 1 ppd.  Attempting to quit.  Will start weaning progressively.  Will call for Chantix if needed.  Genia DelMarie-Lyne Raymund Manrique MD, 3:44 PM 05/02/2018

## 2018-05-03 ENCOUNTER — Other Ambulatory Visit: Payer: Self-pay | Admitting: Obstetrics & Gynecology

## 2018-05-03 ENCOUNTER — Encounter: Payer: Self-pay | Admitting: Obstetrics & Gynecology

## 2018-05-03 DIAGNOSIS — Z1231 Encounter for screening mammogram for malignant neoplasm of breast: Secondary | ICD-10-CM

## 2018-05-03 LAB — PAP IG W/ RFLX HPV ASCU

## 2018-05-03 NOTE — Patient Instructions (Signed)
1. Encounter for gynecological examination with Papanicolaou smear of cervix Normal gynecologic exam.  Pap reflex done.  Breast exam normal.  [facility].  [facility] labs wnl 04/2018.  Continue with fitness and healthy nutrition. - Pap IG w/ reflex to HPV when ASC-U  2. Encounter for routine checking of intrauterine contraceptive device (IUD) Mirena IUD in good location, well tolerated.  3. Smoking trying to quit Cigarette smoking 1 ppd.  Attempting to quit.  Will start weaning progressively.  Will call for Chantix if needed.  Betty Hall, it was a pleasure meeting you today!  I will inform you of your results as soon as they are available.

## 2018-05-30 ENCOUNTER — Ambulatory Visit
Admission: RE | Admit: 2018-05-30 | Discharge: 2018-05-30 | Disposition: A | Discharge status: Home / Self Care | Payer: 59 | Source: Ambulatory Visit | Attending: Obstetrics & Gynecology | Admitting: Obstetrics & Gynecology

## 2018-05-30 DIAGNOSIS — Z1231 Encounter for screening mammogram for malignant neoplasm of breast: Secondary | ICD-10-CM

## 2018-11-06 NOTE — Telephone Encounter (Signed)
Okay for refill?  

## 2018-11-13 MED ORDER — FLUCONAZOLE 150 MG PO TABS: ORAL_TABLET | ORAL | 0 refills | Status: DC

## 2019-05-04 ENCOUNTER — Other Ambulatory Visit: Payer: Self-pay

## 2019-05-04 ENCOUNTER — Other Ambulatory Visit: Payer: Self-pay | Admitting: Obstetrics & Gynecology

## 2019-05-04 DIAGNOSIS — Z1231 Encounter for screening mammogram for malignant neoplasm of breast: Secondary | ICD-10-CM

## 2019-05-07 ENCOUNTER — Ambulatory Visit: Payer: 59 | Admitting: Obstetrics & Gynecology

## 2019-05-07 ENCOUNTER — Encounter: Payer: Self-pay | Admitting: Obstetrics & Gynecology

## 2019-05-07 ENCOUNTER — Other Ambulatory Visit: Payer: Self-pay

## 2019-05-07 VITALS — BP 150/90 | Ht 62.0 in | Wt 128.0 lb

## 2019-05-07 DIAGNOSIS — Z01419 Encounter for gynecological examination (general) (routine) without abnormal findings: Secondary | ICD-10-CM | POA: Diagnosis not present

## 2019-05-07 DIAGNOSIS — Z30431 Encounter for routine checking of intrauterine contraceptive device: Secondary | ICD-10-CM

## 2019-05-07 DIAGNOSIS — Z720 Tobacco use: Secondary | ICD-10-CM | POA: Diagnosis not present

## 2019-05-07 NOTE — Addendum Note (Signed)
Addended by: Thurnell Garbe A on: 05/07/2019 09:54 AM   Modules accepted: Orders

## 2019-05-07 NOTE — Progress Notes (Signed)
Betty Hall June 27, 1976 [id number]   History:    43 y.o. G4P2A2L2 Married.  Daughters 73 yo and 77 yo.  [place] 59 yo.  RP:  Established patient presenting for annual gyn exam   HPI: Well on Mirena IUD x 09/2016.  No BTB.  No pelvic pain. H/O LEEP CIN2 in 1999, margins neg.  Paps normal since then.  No pain with IC.  Urine/BMs wnl.  Breasts normal.  BMI 23.41.  Fasting health labs here today.  Cigarette smoking, trying to quit.   Past medical history,surgical history, family history and social history were all reviewed and documented in the EPIC chart.  Gynecologic History No LMP recorded. (Menstrual status: IUD). Contraception: Mirena IUD x 09/2016 Last Pap: 04/2018. Results were: Negative Last mammogram: 05/2018. Results were: Neg Bone Density: Never Colonoscopy: Never  Obstetric History OB History  Gravida Para Term Preterm AB Living  _0 0 1 2  SAB TAB Ectopic Multiple Live Births  1 0 0 0      # Outcome Date GA Lbr Len/2nd Weight Sex Delivery Anes PTL Lv  3 Term 08/06/11 [id number] / 00:03 6 lb 2.1 oz (2.781 kg) F Vag-Spont None    2 Para           1 SAB              ROS: A ROS was performed and pertinent positives and negatives are included in the history.  GENERAL: No fevers or chills. HEENT: No change in vision, no earache, sore throat or sinus congestion. NECK: No pain or stiffness. CARDIOVASCULAR: No chest pain or pressure. No palpitations. PULMONARY: No shortness of breath, cough or wheeze. GASTROINTESTINAL: No abdominal pain, nausea, vomiting or diarrhea, melena or bright red blood per rectum. GENITOURINARY: No urinary frequency, urgency, hesitancy or dysuria. MUSCULOSKELETAL: No joint or muscle pain, no back pain, no recent trauma. DERMATOLOGIC: No rash, no itching, no lesions. ENDOCRINE: No polyuria, polydipsia, no heat or cold intolerance. No recent change in weight. HEMATOLOGICAL: No anemia or easy bruising or bleeding. NEUROLOGIC: No headache, seizures,  numbness, tingling or weakness. PSYCHIATRIC: No depression, no loss of interest in normal activity or change in sleep pattern.     Exam:   BP (!) 150/90   Ht _1  (1.575 m)   Wt 128 lb (58.1 kg)   BMI 23.41 kg/m   Body mass index is 23.41 kg/m.  General appearance : Well developed well nourished female. No acute distress HEENT: Eyes: no retinal hemorrhage or exudates,  Neck supple, trachea midline, no carotid bruits, no thyroidmegaly Lungs: Clear to auscultation, no rhonchi or wheezes, or rib retractions  Heart: Regular rate and rhythm, no murmurs or gallops Breast:Examined in sitting and supine position were symmetrical in appearance, no palpable masses or tenderness,  no skin retraction, no nipple inversion, no nipple discharge, no skin discoloration, no axillary or supraclavicular lymphadenopathy Abdomen: no palpable masses or tenderness, no rebound or guarding Extremities: no edema or skin discoloration or tenderness  Pelvic: Vulva: Normal             Vagina: No gross lesions or discharge  Cervix: No gross lesions or discharge.  [facility]  Pap reflex done.  Uterus  RV, normal size, shape and consistency, non-tender and mobile  Adnexa  Without masses or tenderness  Anus: Normal   Assessment/Plan:  43y.o. female for annual exam   1. Encounter for gynecological examination with Papanicolaou smear of  cervix Normal gynecologic exam.  Pap reflex done.  Remote history of LEEP in 1999.  Breast exam normal.  Screening mammogram schedule in October 2020.  Good body mass index at 23.41.  Continue with fitness and healthy nutrition.  Fasting health labs here today. - CBC - TSH - Lipid panel - Comp Met (CMET) - VITAMIN D 25 Hydroxy (Vit-D Deficiency, Fractures)  2. Encounter for routine checking of intrauterine contraceptive device (IUD) Well on Mirena IUD since January 2018.  IUD in good position.  3. Smoking trying to quit Recommend to quit smoking.   Princess Bruins MD, 9:21 AM 05/07/2019

## 2019-05-07 NOTE — Patient Instructions (Signed)
1. Encounter for gynecological examination with Papanicolaou smear of cervix Normal gynecologic exam.  Pap reflex done.  Remote history of LEEP in 1999.  Breast exam normal.  Screening mammogram schedule in October 2020.  Good body mass index at 23.41.  Continue with fitness and healthy nutrition.  Fasting health labs here today. - CBC - TSH - Lipid panel - Comp Met (CMET) - VITAMIN D 25 Hydroxy (Vit-D Deficiency, Fractures)  2. Encounter for routine checking of intrauterine contraceptive device (IUD) Well on Mirena IUD since January 2018.  IUD in good position.  3. Smoking trying to quit Recommend to quit smoking.  Betty Hall, it was a pleasure seeing you today!  I will inform you of your results as soon as they are available.

## 2019-05-08 LAB — VITAMIN D 25 HYDROXY (VIT D DEFICIENCY, FRACTURES): Vit D, 25-Hydroxy: 38 ng/mL (ref 30–100)

## 2019-05-08 LAB — COMPREHENSIVE METABOLIC PANEL
AG Ratio: 2.1 (calc) (ref 1.0–2.5)
ALT: 22 U/L (ref 6–29)
AST: 17 U/L (ref 10–30)
Albumin: 4.8 g/dL (ref 3.6–5.1)
Alkaline phosphatase (APISO): 42 U/L (ref 31–125)
BUN: 13 mg/dL (ref 7–25)
CO2: 26 mmol/L (ref 20–32)
Calcium: 9.6 mg/dL (ref 8.6–10.2)
Chloride: 103 mmol/L (ref 98–110)
Creat: 0.83 mg/dL (ref 0.50–1.10)
Globulin: 2.3 g/dL (calc) (ref 1.9–3.7)
Glucose, Bld: 89 mg/dL (ref 65–99)
Potassium: 4.5 mmol/L (ref 3.5–5.3)
Sodium: 137 mmol/L (ref 135–146)
Total Bilirubin: 0.9 mg/dL (ref 0.2–1.2)
Total Protein: 7.1 g/dL (ref 6.1–8.1)

## 2019-05-08 LAB — CBC
HCT: 50.2 % — ABNORMAL HIGH (ref 35.0–45.0)
Hemoglobin: 17 g/dL — ABNORMAL HIGH (ref 11.7–15.5)
MCH: 31.2 pg (ref 27.0–33.0)
MCHC: 33.9 g/dL (ref 32.0–36.0)
MCV: 92.1 fL (ref 80.0–100.0)
MPV: 11.3 fL (ref 7.5–12.5)
Platelets: 161 10*3/uL (ref 140–400)
RBC: 5.45 10*6/uL — ABNORMAL HIGH (ref 3.80–5.10)
RDW: 12.3 % (ref 11.0–15.0)
WBC: 6.7 10*3/uL (ref 3.8–10.8)

## 2019-05-08 LAB — PAP IG W/ RFLX HPV ASCU

## 2019-05-08 LAB — TSH: TSH: 1.07 mIU/L

## 2019-05-08 LAB — LIPID PANEL
Cholesterol: 162 mg/dL (ref <200)
HDL: 50 mg/dL (ref >=50)
LDL Cholesterol (Calc): 96 mg/dL (calc)
Non-HDL Cholesterol (Calc): 112 mg/dL (calc) (ref <130)
Total CHOL/HDL Ratio: 3.2 (calc) (ref <5.0)
Triglycerides: 70 mg/dL (ref <150)

## 2019-05-11 ENCOUNTER — Telehealth: Payer: Self-pay | Admitting: *Deleted

## 2019-05-11 ENCOUNTER — Telehealth: Payer: Self-pay

## 2019-05-11 ENCOUNTER — Other Ambulatory Visit: Payer: Self-pay

## 2019-05-11 DIAGNOSIS — D582 Other hemoglobinopathies: Secondary | ICD-10-CM

## 2019-05-11 NOTE — Telephone Encounter (Signed)
-----   Message from Ramond Craver, [place] sent at 05/11/2019 10:38 AM EDT ----- Regarding: referral to hematology CBC abnormal, Hemoglobin too high at 17.0.  Hemochromatosis?  Refer to Hematologist.   Patient aware.  Thanks

## 2019-05-11 NOTE — Telephone Encounter (Signed)
Referral placed for hematology appointment, they will to schedule.

## 2019-05-11 NOTE — Telephone Encounter (Signed)
Agree with Chantix prescription.

## 2019-05-11 NOTE — Telephone Encounter (Signed)
Patient was just in for CE on 05/07/19 and forgot to ask your for Rx for Chantix to help with stop smoking.

## 2019-05-14 MED ORDER — VARENICLINE TARTRATE 1 MG PO TABS: 1.0000 mg | ORAL_TABLET | Freq: Two times a day (BID) | ORAL | 3 refills | Status: DC

## 2019-05-14 MED ORDER — VARENICLINE TARTRATE 0.5 MG PO TABS: 0.5000 mg | ORAL_TABLET | Freq: Two times a day (BID) | ORAL | 0 refills | Status: DC

## 2019-05-14 NOTE — Telephone Encounter (Signed)
Spoke with patient and informed her. Rx sent. 

## 2019-05-22 ENCOUNTER — Telehealth: Payer: Self-pay | Admitting: Adult Health

## 2019-05-22 NOTE — Telephone Encounter (Signed)
Received a new hem referral from Dr. Dellis Filbert for elevated hgb. Betty Hall cld to schedule an appt. She's been scheduled to see Wilber Bihari on 9/22 at 830am. Aware to arrive 15 minutes early.

## 2019-05-23 NOTE — Telephone Encounter (Signed)
Patient scheduled on 06/05/19

## 2019-06-04 ENCOUNTER — Telehealth: Payer: Self-pay | Admitting: Adult Health

## 2019-06-04 NOTE — Telephone Encounter (Signed)
Pt has been rescheduled to see Mendel Ryder on 9/29 at 330pm. Ms. Bigler is aware to arrive 15 minutes early.

## 2019-06-05 ENCOUNTER — Inpatient Hospital Stay: Payer: 59 | Admitting: Adult Health

## 2019-06-11 ENCOUNTER — Telehealth: Payer: Self-pay | Admitting: Hematology

## 2019-06-11 NOTE — Telephone Encounter (Signed)
Pt cld to reschedule appt to see Dr. Irene Limbo on 10/19 at [employer] to arrive 15 minutes early.

## 2019-06-12 ENCOUNTER — Inpatient Hospital Stay: Payer: 59 | Admitting: Adult Health

## 2019-06-14 ENCOUNTER — Other Ambulatory Visit: Payer: Self-pay | Admitting: Obstetrics & Gynecology

## 2019-07-02 ENCOUNTER — Other Ambulatory Visit: Payer: Self-pay

## 2019-07-02 ENCOUNTER — Inpatient Hospital Stay: Payer: 59

## 2019-07-02 ENCOUNTER — Telehealth: Payer: Self-pay | Admitting: Hematology

## 2019-07-02 ENCOUNTER — Inpatient Hospital Stay: Payer: 59 | Attending: Adult Health | Admitting: Hematology

## 2019-07-02 VITALS — BP 132/85 | HR 73 | Temp 98.3°F | Resp 18 | Ht 62.0 in | Wt 134.6 lb

## 2019-07-02 DIAGNOSIS — Z833 Family history of diabetes mellitus: Secondary | ICD-10-CM | POA: Diagnosis not present

## 2019-07-02 DIAGNOSIS — Z793 Long term (current) use of hormonal contraceptives: Secondary | ICD-10-CM

## 2019-07-02 DIAGNOSIS — Z8249 Family history of ischemic heart disease and other diseases of the circulatory system: Secondary | ICD-10-CM | POA: Insufficient documentation

## 2019-07-02 DIAGNOSIS — D751 Secondary polycythemia: Secondary | ICD-10-CM

## 2019-07-02 DIAGNOSIS — F17210 Nicotine dependence, cigarettes, uncomplicated: Secondary | ICD-10-CM | POA: Insufficient documentation

## 2019-07-02 DIAGNOSIS — Z800 Family history of malignant neoplasm of digestive organs: Secondary | ICD-10-CM | POA: Diagnosis not present

## 2019-07-02 DIAGNOSIS — R0602 Shortness of breath: Secondary | ICD-10-CM | POA: Diagnosis not present

## 2019-07-02 DIAGNOSIS — I10 Essential (primary) hypertension: Secondary | ICD-10-CM | POA: Diagnosis not present

## 2019-07-02 LAB — CBC WITH DIFFERENTIAL/PLATELET
Abs Immature Granulocytes: 0.01 10*3/uL (ref 0.00–0.07)
Basophils Absolute: 0 10*3/uL (ref 0.0–0.1)
Basophils Relative: 0 %
Eosinophils Absolute: 0.1 10*3/uL (ref 0.0–0.5)
Eosinophils Relative: 2 %
HCT: 43.2 % (ref 36.0–46.0)
Hemoglobin: 14.9 g/dL (ref 12.0–15.0)
Immature Granulocytes: 0 %
Lymphocytes Relative: 33 %
Lymphs Abs: 1.7 10*3/uL (ref 0.7–4.0)
MCH: 31 pg (ref 26.0–34.0)
MCHC: 34.5 g/dL (ref 30.0–36.0)
MCV: 89.8 fL (ref 80.0–100.0)
Monocytes Absolute: 0.4 10*3/uL (ref 0.1–1.0)
Monocytes Relative: 8 %
Neutro Abs: 3.1 10*3/uL (ref 1.7–7.7)
Neutrophils Relative %: 57 %
Platelets: 147 10*3/uL — ABNORMAL LOW (ref 150–400)
RBC: 4.81 MIL/uL (ref 3.87–5.11)
RDW: 11.7 % (ref 11.5–15.5)
WBC: 5.3 10*3/uL (ref 4.0–10.5)
nRBC: 0 % (ref 0.0–0.2)

## 2019-07-02 LAB — CMP (CANCER CENTER ONLY)
ALT: 26 U/L (ref 0–44)
AST: 17 U/L (ref 15–41)
Albumin: 4.4 g/dL (ref 3.5–5.0)
Alkaline Phosphatase: 46 U/L (ref 38–126)
Anion gap: 9 (ref 5–15)
BUN: 11 mg/dL (ref 6–20)
CO2: 26 mmol/L (ref 22–32)
Calcium: 8.9 mg/dL (ref 8.9–10.3)
Chloride: 104 mmol/L (ref 98–111)
Creatinine: 0.95 mg/dL (ref 0.44–1.00)
GFR, Est AFR Am: >60 mL/min (ref >60)
GFR, Estimated: >60 mL/min (ref >60)
Glucose, Bld: 91 mg/dL (ref 70–99)
Potassium: 4.2 mmol/L (ref 3.5–5.1)
Sodium: 139 mmol/L (ref 135–145)
Total Bilirubin: 0.8 mg/dL (ref 0.3–1.2)
Total Protein: 6.7 g/dL (ref 6.5–8.1)

## 2019-07-02 NOTE — Telephone Encounter (Signed)
Scheduled per 10/19 los, checked patient in for labs and called patient regarding upcoming appointments.

## 2019-07-02 NOTE — Patient Instructions (Signed)
Thank you for choosing [facility] to provide your oncology and hematology care.   Should you have questions after your visit to the [facility] ([facility]), please contact this office at [phone] between 8:30 AM and 4:30 PM. Voicemails left after 4:00 PM may not be returned until the following business day. Calls received after 4:30 PM will be answered by an off-site Nurse Triage Line.    Prescription Refills:  Please have your pharmacy contact us directly for most prescription requests.  Contact the office directly for refills of narcotics (pain medications). Allow 48-72 hours for refills.  Appointments: Please contact the [facility] scheduling department [phone] for questions regarding [facility] appointment scheduling.  Contact the schedulers with any scheduling changes so that your appointment can be rescheduled in a timely manner.   Central Scheduling for [place] [phone] - Call to schedule procedures such as PET scans, CT scans, MRI, Ultrasound, etc.  To afford each patient quality time with our providers, please arrive 30 minutes before your scheduled appointment time.  If you arrive late for your appointment, you may be asked to reschedule.  We strive to give you quality time with our providers, and arriving late affects you and other patients whose appointments are after yours. If you are a no show for multiple scheduled visits, you may be dismissed from the clinic at the providers discretion.     Resources: [facility] Social Workers [phone] for additional information on assistance programs --Anne Cunningham/Abigail Elmore  [place] DSS  [phone]: Information regarding food stamps, Medicaid, and utility assistance [place] 336-333-6589   [place] [employer]'s shared-ride transportation service for eligible riders who have a disability that prevents them from riding the fixed route bus.   Medicare Rights Center 800-333-4114 Helps people with  Medicare understand their rights and benefits, navigate the Medicare system, and secure the quality healthcare they deserve [employer] 800-227-2345 Assists patients locate various types of support and financial assistance Cancer Care: 1-800-813-HOPE (4673) Provides financial assistance, online support groups, medication/co-pay assistance.      

## 2019-07-02 NOTE — Progress Notes (Signed)
HEMATOLOGY/ONCOLOGY CONSULTATION NOTE  Date of Service: 07/02/2019  Patient Care Team: Shirlean MylarWebb, Carol, MD as PCP - General (Family Medicine)  CHIEF COMPLAINTS/PURPOSE OF CONSULTATION:  Elevated Hgb  HISTORY OF PRESENTING ILLNESS:   Betty Hall is a wonderful 43 y.o. female who has been referred to us by Ted McalpineJennifer Webb, RMA for evaluation and management of elevated Hgb. The pt reports that she is doing well overall.  The pt reports that she does not currently have any medical conditions. She has recently reduced how much she is smoking after being a long-term smoker. She is currently down to about 3-4 cigarettes per day from 1 ppd. Pt denies any COPD or emphysema. Pt has has had an episode of bronchitis that progressed to pneumonia, although there were no chest XRs to confirm diagnosis. She did notice that she had some SOB during this time that has not experienced outside of this episode. Pt had HTN that was was worsened with her first pregnancy. She has lost weight in recent years and she no longer has HTN. Pt had a 9.5 cm hematoma inside of her uterus for several months after one of her pregnancies that was caused by a subchorionic hemorrhage. Pt admits that she does not drink enough water, she drinks coffee and water with crystal light throughout the day. Pt denies any blood conditions or blood cancers in her family or a personal history of blood clots. Her grandfather has colon cancer, her aunt was diagnosed with pancreatic cancer when she was in her mid 9870's. Pt is on 1 mg BID Chantix and has an IUD. Pt does take Aleve for hip and shoulder pain. Pt notes that there is a possibility that she was dehydrated at the time of her last labs, she was also smoking more during this time.  Most recent lab results (05/07/2019) of CBC & CMET is as follows: all values are WNL except for RBC at 5.45, Hgb at 17.0, HCT at 50.2. 05/07/2019 TSH at 1.07 05/07/2019 Vitamin D 25-Hydroxy at 38  On review of  systems, pt and denies unexpected weight loss, SOB, leg swelling and any other symptoms.   On PMHx the pt reports HTN, cervical biopsy, gallbladder surgery, knee surgery, subchorionic hemorrhage. On Social Hx the pt reports occasional drinker (not outside of social situations), long-term smoker 3-4 cigarettes per day (previously 1ppd) On Family Hx the pt reports grandfather with colon cancer, aunt with pancreatic cancer   MEDICAL HISTORY:  Past Medical History:  Diagnosis Date  . Hypertension     SURGICAL HISTORY: Past Surgical History:  Procedure Laterality Date  . CERVICAL BIOPSY  W/ LOOP ELECTRODE EXCISION  1999   CIN II  . GALLBLADDER SURGERY  2002  . knee surgeries  2011, 2010, 2009  . KNEE SURGERY Left FEB 2017     SOCIAL HISTORY: Social History   Socioeconomic History  . Marital status: Married    Spouse name: Not on file  . Number of children: Not on file  . Years of education: Not on file  . Highest education level: Not on file  Occupational History  . Not on file  Social Needs  . Financial resource strain: Not on file  . Food insecurity    Worry: Not on file    Inability: Not on file  . Transportation needs    Medical: Not on file    Non-medical: Not on file  Tobacco Use  . Smoking status: Current Every Day Smoker  Packs/day: 0.50    Types: Cigarettes  . Smokeless tobacco: Never Used  Substance and Sexual Activity  . Alcohol use: Yes    Alcohol/week: 0.0 standard drinks    Comment: SOCIAL  . Drug use: No  . Sexual activity: Yes    Partners: Male    Birth control/protection: I.U.D.    Comment: MIRENA. 1st intercourse- 16, partners- 5  Lifestyle  . Physical activity    Days per week: Not on file    Minutes per session: Not on file  . Stress: Not on file  Relationships  . Social Musician on phone: Not on file    Gets together: Not on file    Attends religious service: Not on file    Active member of club or organization: Not on  file    Attends meetings of clubs or organizations: Not on file    Relationship status: Not on file  . Intimate partner violence    Fear of current or ex partner: Not on file    Emotionally abused: Not on file    Physically abused: Not on file    Forced sexual activity: Not on file  Other Topics Concern  . Not on file  Social History Narrative  . Not on file    FAMILY HISTORY: Family History  Problem Relation Age of Onset  . Hypertension Father   . Heart disease Maternal Grandmother   . Diabetes Maternal Grandmother   . Cancer Maternal Grandfather        COLON  . Hypertension Paternal Grandfather     ALLERGIES:  has No Known Allergies.  MEDICATIONS:  Current Outpatient Medications  Medication Sig Dispense Refill  . levonorgestrel (MIRENA) 20 MCG/24HR IUD 1 each by Intrauterine route once.    . varenicline (CHANTIX CONTINUING MONTH PAK) 1 MG tablet Take 1 tablet (1 mg total) by mouth 2 (two) times daily. 60 tablet 3  . varenicline (CHANTIX) 0.5 MG tablet Take 1 tablet (0.5 mg total) by mouth 2 (two) times daily. START WITH THIS RX. 60 tablet 0   No current facility-administered medications for this visit.     REVIEW OF SYSTEMS:    10 Point review of Systems was done is negative except as noted above.  PHYSICAL EXAMINATION: ECOG PERFORMANCE STATUS: 0 - Asymptomatic  . Vitals:   07/02/19 1320  BP: 132/85  Pulse: 73  Resp: 18  Temp: 98.3 F (36.8 C)  SpO2: 100%   Filed Weights   07/02/19 1320  Weight: 134 lb 9.6 oz (61.1 kg)   .Body mass index is 24.62 kg/m.  GENERAL:alert, in no acute distress and comfortable SKIN: no acute rashes, no significant lesions EYES: conjunctiva are pink and non-injected, sclera anicteric OROPHARYNX: MMM, no exudates, no oropharyngeal erythema or ulceration NECK: supple, no JVD LYMPH:  no palpable lymphadenopathy in the cervical, axillary or inguinal regions LUNGS: clear to auscultation b/l with normal respiratory effort  HEART: regular rate & rhythm ABDOMEN:  normoactive bowel sounds , non tender, not distended. Extremity: no pedal edema PSYCH: alert & oriented x 3 with fluent speech NEURO: no focal motor/sensory deficits  LABORATORY DATA:  I have reviewed the data as listed  . CBC Latest Ref Rng & Units 05/07/2019 04/28/2018 01/28/2017  WBC 3.8 - 10.8 Thousand/uL 6.7 6.2 4.9  Hemoglobin 11.7 - 15.5 g/dL 17.0(H) 15.9(H) 15.6(H)  Hematocrit 35.0 - 45.0 % 50.2(H) 46.2(H) 46.0(H)  Platelets 140 - 400 Thousand/uL 161 164 173    .  CMP Latest Ref Rng & Units 05/07/2019 04/28/2018 01/28/2017  Glucose 65 - 99 mg/dL 89 87 99  BUN 7 - 25 mg/dL 13 11 9   Creatinine 0.50 - 1.10 mg/dL 0.83 0.78 0.77  Sodium 135 - 146 mmol/L 137 137 137  Potassium 3.5 - 5.3 mmol/L 4.5 4.5 4.2  Chloride 98 - 110 mmol/L 103 102 103  CO2 20 - 32 mmol/L 26 24 26   Calcium 8.6 - 10.2 mg/dL 9.6 9.7 9.4  Total Protein 6.1 - 8.1 g/dL 7.1 7.1 6.9  Total Bilirubin 0.2 - 1.2 mg/dL 0.9 0.8 0.8  Alkaline Phos 33 - 115 U/L - - 47  AST 10 - 30 U/L 17 17 12   ALT 6 - 29 U/L 22 14 11      RADIOGRAPHIC STUDIES: I have personally reviewed the radiological images as listed and agreed with the findings in the report. No results found.  ASSESSMENT & PLAN:   43 yo with   1) Polycythemia  ?secondary polycythemia due to smoking/smoking related lung disease vs less likely PCV PLAN:  -Discussed patient's most recent labs from 05/07/2019,  all values are WNL except for RBC at 5.45, Hgb at 17.0, HCT at 50.2. -Discussed 05/07/2019 TSH at 1.07 -Discussed 05/07/2019 Vitamin D 25-Hydroxy at 38 -Discussed that increased RBC could be a reactive process or a primary bone marrow disorder  -Discussed that a primary bone marrow disorder could result in an increase in blood clots or heart attacks -Based on labs and clinical evidence not likely pt has Polycythemia Vera -Pt's increasing RBC could be due to long-term history of smoking -Advised smoking cessation   -Recommended that the pt continue to eat well, drink at least 48-64 oz of water each day, and walk 20-30 minutes each day.  -Discussed that if RBCs remain high after smoking cessation and pt is neg for JAK2 mutation, PCP may get pulmonary function testing to r/o emphysema or COPD -Will get labs today  -Will order genetic (JAK2) testing -Will see back in 2 weeks via phone    FOLLOW UP: Labs today Phone visit in 2 weeks with Dr Irene Limbo  All of the patients questions were answered with apparent satisfaction. The patient knows to call the clinic with any problems, questions or concerns.  I spent 30 mins counseling the patient face to face. The total time spent in the appointment was 45 minutes and more than 50% was on counseling and direct patient cares.    Sullivan Lone MD [place] AAHIVMS [facility] Hematology/Oncology Physician [facility]  (Office):       [phone] (Work cell):  [phone] (Fax):           [phone]  07/02/2019 4:29 PM  I, Yevette Edwards, am acting as a scribe for Dr. Sullivan Lone.   .I have reviewed the above documentation for accuracy and completeness, and I agree with the above. Brunetta Genera MD

## 2019-07-11 LAB — JAK2 (INCLUDING V617F AND EXON 12), MPL,& CALR-NEXT GEN SEQ

## 2019-07-16 ENCOUNTER — Other Ambulatory Visit: Payer: Self-pay

## 2019-07-16 ENCOUNTER — Inpatient Hospital Stay: Payer: 59 | Attending: Adult Health | Admitting: Hematology

## 2019-07-16 ENCOUNTER — Telehealth: Payer: Self-pay | Admitting: Hematology

## 2019-07-16 VITALS — BP 137/90 | HR 82 | Temp 98.2°F | Resp 18 | Ht 62.0 in | Wt 131.6 lb

## 2019-07-16 DIAGNOSIS — F17210 Nicotine dependence, cigarettes, uncomplicated: Secondary | ICD-10-CM | POA: Insufficient documentation

## 2019-07-16 DIAGNOSIS — Z8249 Family history of ischemic heart disease and other diseases of the circulatory system: Secondary | ICD-10-CM | POA: Insufficient documentation

## 2019-07-16 DIAGNOSIS — R58 Hemorrhage, not elsewhere classified: Secondary | ICD-10-CM | POA: Diagnosis not present

## 2019-07-16 DIAGNOSIS — M25559 Pain in unspecified hip: Secondary | ICD-10-CM | POA: Insufficient documentation

## 2019-07-16 DIAGNOSIS — Z800 Family history of malignant neoplasm of digestive organs: Secondary | ICD-10-CM | POA: Diagnosis not present

## 2019-07-16 DIAGNOSIS — Z79899 Other long term (current) drug therapy: Secondary | ICD-10-CM | POA: Diagnosis not present

## 2019-07-16 DIAGNOSIS — I10 Essential (primary) hypertension: Secondary | ICD-10-CM | POA: Diagnosis not present

## 2019-07-16 DIAGNOSIS — Z833 Family history of diabetes mellitus: Secondary | ICD-10-CM | POA: Insufficient documentation

## 2019-07-16 DIAGNOSIS — D751 Secondary polycythemia: Secondary | ICD-10-CM | POA: Insufficient documentation

## 2019-07-16 DIAGNOSIS — M25519 Pain in unspecified shoulder: Secondary | ICD-10-CM | POA: Insufficient documentation

## 2019-07-16 NOTE — Telephone Encounter (Signed)
Per 11/2 los RTC with Dr Irene Limbo as needed

## 2019-07-16 NOTE — Progress Notes (Signed)
HEMATOLOGY/ONCOLOGY CONSULTATION NOTE  Date of Service: 07/16/2019  Patient Care Team: Maurice Small, MD as PCP - General (Family Medicine)  CHIEF COMPLAINTS/PURPOSE OF CONSULTATION:  Elevated Hgb  HISTORY OF PRESENTING ILLNESS:   Betty Hall is a wonderful 43 y.o. female who has been referred to [place] by Kelvin Cellar, RMA for evaluation and management of elevated Hgb. The pt reports that she is doing well overall.  The pt reports that she does not currently have any medical conditions. She has recently reduced how much she is smoking after being a long-term smoker. She is currently down to about 3-4 cigarettes per day from 1 ppd. Pt denies any COPD or emphysema. Pt has has had an episode of bronchitis that progressed to pneumonia, although there were no chest XRs to confirm diagnosis. She did notice that she had some SOB during this time that has not experienced outside of this episode. Pt had HTN that was was worsened with her first pregnancy. She has lost weight in recent years and she no longer has HTN. Pt had a 9.5 cm hematoma inside of her uterus for several months after one of her pregnancies that was caused by a subchorionic hemorrhage. Pt admits that she does not drink enough water, she drinks coffee and water with crystal light throughout the day. Pt denies any blood conditions or blood cancers in her family or a personal history of blood clots. Her grandfather has colon cancer, her aunt was diagnosed with pancreatic cancer when she was in her mid 36's. Pt is on 1 mg BID Chantix and has an IUD. Pt does take Aleve for hip and shoulder pain. Pt notes that there is a possibility that she was dehydrated at the time of her last labs, she was also smoking more during this time.  Most recent lab results (05/07/2019) of CBC & CMET is as follows: all values are WNL except for RBC at 5.45, Hgb at 17.0, HCT at 50.2. 05/07/2019 TSH at 1.07 05/07/2019 Vitamin D 25-Hydroxy at 38  On review of  systems, pt and denies unexpected weight loss, SOB, leg swelling and any other symptoms.   On PMHx the pt reports HTN, cervical biopsy, gallbladder surgery, knee surgery, subchorionic hemorrhage. On Social Hx the pt reports occasional drinker (not outside of social situations), long-term smoker 3-4 cigarettes per day (previously 1ppd) On Family Hx the pt reports grandfather with colon cancer, aunt with pancreatic cancer  INTERVAL HISTORY:  Betty Hall is a wonderful 43 y.o. female who is here for evaluation and management of elevated Hgb. The patient's last visit with [place] was on 07/02/2019. The pt reports that she is doing well overall.  The pt reports no new concerns in the interim. Pt is continuing to cut down on her smoking, only 2-3 per day. She does feel cold all of the time throughout her entire body, which is new within the last 5 years. Her weight has been stable since 2013.   Of note since the patient's last visit, pt has had JAK 2, MPL, CALR completed on 07/02/2019 with results revealing "No mutations identified".  Lab results (07/02/19) of CBC w/diff and CMP is as follows: all values are WNL except for PLTs at 147K.  On review of systems, pt reports feeling cold and denies abdominal pain, leg swelling and any other symptoms.    MEDICAL HISTORY:  Past Medical History:  Diagnosis Date  . Hypertension     SURGICAL HISTORY: Past Surgical History:  Procedure Laterality Date  . CERVICAL BIOPSY  W/ LOOP ELECTRODE EXCISION  1999   CIN II  . GALLBLADDER SURGERY  2002  . knee surgeries  2011, 2010, 2009  . KNEE SURGERY Left FEB 2017     SOCIAL HISTORY: Social History   Socioeconomic History  . Marital status: Married    Spouse name: Not on file  . Number of children: Not on file  . Years of education: Not on file  . Highest education level: Not on file  Occupational History  . Not on file  Social Needs  . Financial resource strain: Not on file  . Food insecurity     Worry: Not on file    Inability: Not on file  . Transportation needs    Medical: Not on file    Non-medical: Not on file  Tobacco Use  . Smoking status: Current Every Day Smoker    Packs/day: 0.50    Types: Cigarettes  . Smokeless tobacco: Never Used  Substance and Sexual Activity  . Alcohol use: Yes    Alcohol/week: 0.0 standard drinks    Comment: SOCIAL  . Drug use: No  . Sexual activity: Yes    Partners: Male    Birth control/protection: I.U.D.    Comment: MIRENA. 1st intercourse- 16, partners- 5  Lifestyle  . Physical activity    Days per week: Not on file    Minutes per session: Not on file  . Stress: Not on file  Relationships  . Social Musician on phone: Not on file    Gets together: Not on file    Attends religious service: Not on file    Active member of club or organization: Not on file    Attends meetings of clubs or organizations: Not on file    Relationship status: Not on file  . Intimate partner violence    Fear of current or ex partner: Not on file    Emotionally abused: Not on file    Physically abused: Not on file    Forced sexual activity: Not on file  Other Topics Concern  . Not on file  Social History Narrative  . Not on file    FAMILY HISTORY: Family History  Problem Relation Age of Onset  . Hypertension Father   . Heart disease Maternal Grandmother   . Diabetes Maternal Grandmother   . Cancer Maternal Grandfather        COLON  . Hypertension Paternal Grandfather     ALLERGIES:  has No Known Allergies.  MEDICATIONS:  Current Outpatient Medications  Medication Sig Dispense Refill  . levonorgestrel (MIRENA) 20 MCG/24HR IUD 1 each by Intrauterine route once.    . varenicline (CHANTIX CONTINUING MONTH PAK) 1 MG tablet Take 1 tablet (1 mg total) by mouth 2 (two) times daily. 60 tablet 3  . varenicline (CHANTIX) 0.5 MG tablet Take 1 tablet (0.5 mg total) by mouth 2 (two) times daily. START WITH THIS RX. 60 tablet 0   No  current facility-administered medications for this visit.     REVIEW OF SYSTEMS:   A 10+ POINT REVIEW OF SYSTEMS WAS OBTAINED including neurology, dermatology, psychiatry, cardiac, respiratory, lymph, extremities, GI, GU, Musculoskeletal, constitutional, breasts, reproductive, HEENT.  All pertinent positives are noted in the HPI.  All others are negative.   PHYSICAL EXAMINATION: ECOG PERFORMANCE STATUS: 0 - Asymptomatic  . Vitals:   07/16/19 1454  BP: 137/90  Pulse: 82  Resp: 18  Temp: 98.2 F (  36.8 C)  SpO2: 100%   Filed Weights   07/16/19 1454  Weight: 131 lb 9 oz (59.7 kg)   .Body mass index is 24.06 kg/m.  GENERAL:alert, in no acute distress and comfortable SKIN: no acute rashes, no significant lesions EYES: conjunctiva are pink and non-injected, sclera anicteric OROPHARYNX: MMM, no exudates, no oropharyngeal erythema or ulceration NECK: supple, no JVD LYMPH:  no palpable lymphadenopathy in the cervical, axillary or inguinal regions LUNGS: clear to auscultation b/l with normal respiratory effort HEART: regular rate & rhythm ABDOMEN:  normoactive bowel sounds , non tender, not distended. No palpable hepatosplenomegaly.  Extremity: no pedal edema PSYCH: alert & oriented x 3 with fluent speech NEURO: no focal motor/sensory deficits  LABORATORY DATA:  I have reviewed the data as listed  . CBC Latest Ref Rng & Units 07/02/2019 05/07/2019 04/28/2018  WBC 4.0 - 10.5 K/uL 5.3 6.7 6.2  Hemoglobin 12.0 - 15.0 g/dL 16.114.9 17.0(H) 15.9(H)  Hematocrit 36.0 - 46.0 % 43.2 50.2(H) 46.2(H)  Platelets 150 - 400 K/uL 147(L) 161 164    . CMP Latest Ref Rng & Units 07/02/2019 05/07/2019 04/28/2018  Glucose 70 - 99 mg/dL 91 89 87  BUN 6 - 20 mg/dL 11 13 11   Creatinine 0.44 - 1.00 mg/dL 0.960.95 0.450.83 4.090.78  Sodium 135 - 145 mmol/L 139 137 137  Potassium 3.5 - 5.1 mmol/L 4.2 4.5 4.5  Chloride 98 - 111 mmol/L 104 103 102  CO2 22 - 32 mmol/L 26 26 24   Calcium 8.9 - 10.3 mg/dL 8.9 9.6 9.7   Total Protein 6.5 - 8.1 g/dL 6.7 7.1 7.1  Total Bilirubin 0.3 - 1.2 mg/dL 0.8 0.9 0.8  Alkaline Phos 38 - 126 U/L 46 - -  AST 15 - 41 U/L 17 17 17   ALT 0 - 44 U/L 26 22 14    07/02/2019 JAK2 Report:     RADIOGRAPHIC STUDIES: I have personally reviewed the radiological images as listed and agreed with the findings in the report. No results found.  ASSESSMENT & PLAN:   43 yo with   1) Polycythemia  ?secondary polycythemia due to smoking/smoking related lung disease vs less likely PCV 05/07/2019 RBC at 5.45, Hgb at 17.0, HCT at 50.2.  PLAN:  -Discussed pt lab work, 07/02/19; all values are WNL except for PLTs at 147K. Hgb, HCT and RBC have normalized.  -Discussed 07/02/2019 JAK 2, MPL, CALR which revealed "No mutations identified". Have r/o primary bone marrow problem.  -Polycythemia appears to be secondary to smoking -Based on labs and clinical evidence not likely pt has Polycythemia Vera -Do not recommend additional testing at this time -Advised complete smoking cessation  -Will see back as needed    FOLLOW UP: RTC with Dr Candise Che as needed  The total time spent in the appt was 15 minutes and more than 50% was on counseling and direct patient cares.  All of the patient's questions were answered with apparent satisfaction. The patient knows to call the clinic with any problems, questions or concerns.   Wyvonnia Lora  MD MS AAHIVMS [facility] Hematology/Oncology Physician [facility]  (Office):       [phone] (Work cell):  [phone] (Fax):           [phone]  07/16/2019 3:19 PM  I, Carollee HerterJazzmine Knight, am acting as a scribe for Dr. Wyvonnia Lora .   .I have reviewed the above documentation for accuracy and completeness, and I agree with the above. Johney Maine. Kishore  MD

## 2019-07-23 ENCOUNTER — Other Ambulatory Visit: Payer: Self-pay

## 2019-07-23 ENCOUNTER — Ambulatory Visit
Admission: RE | Admit: 2019-07-23 | Discharge: 2019-07-23 | Disposition: A | Discharge status: Home / Self Care | Payer: 59 | Source: Ambulatory Visit | Attending: Obstetrics & Gynecology | Admitting: Obstetrics & Gynecology

## 2019-07-23 DIAGNOSIS — Z1231 Encounter for screening mammogram for malignant neoplasm of breast: Secondary | ICD-10-CM

## 2019-09-10 ENCOUNTER — Other Ambulatory Visit: Payer: Self-pay | Admitting: Obstetrics & Gynecology

## 2019-09-10 MED ORDER — FLUCONAZOLE 150 MG PO TABS: 150.0000 mg | ORAL_TABLET | Freq: Once | ORAL | 0 refills | Status: AC

## 2019-09-10 NOTE — Telephone Encounter (Signed)
ML patient. [place] for these Rx's?

## 2019-09-11 NOTE — Telephone Encounter (Signed)
Cannot refill Chantix without an office visit.  Recommend Fam MD Dr Justin Mend.

## 2020-04-30 ENCOUNTER — Other Ambulatory Visit: Payer: Self-pay | Admitting: Obstetrics & Gynecology

## 2020-04-30 DIAGNOSIS — Z1231 Encounter for screening mammogram for malignant neoplasm of breast: Secondary | ICD-10-CM

## 2020-05-27 ENCOUNTER — Ambulatory Visit (INDEPENDENT_AMBULATORY_CARE_PROVIDER_SITE_OTHER): Payer: 59 | Admitting: Obstetrics & Gynecology

## 2020-05-27 ENCOUNTER — Encounter: Payer: Self-pay | Admitting: Obstetrics & Gynecology

## 2020-05-27 ENCOUNTER — Other Ambulatory Visit: Payer: Self-pay

## 2020-05-27 VITALS — BP 122/80 | Ht 62.0 in | Wt 127.0 lb

## 2020-05-27 DIAGNOSIS — Z30431 Encounter for routine checking of intrauterine contraceptive device: Secondary | ICD-10-CM

## 2020-05-27 DIAGNOSIS — Z720 Tobacco use: Secondary | ICD-10-CM

## 2020-05-27 DIAGNOSIS — Z01419 Encounter for gynecological examination (general) (routine) without abnormal findings: Secondary | ICD-10-CM | POA: Diagnosis not present

## 2020-05-27 DIAGNOSIS — N871 Moderate cervical dysplasia: Secondary | ICD-10-CM | POA: Diagnosis not present

## 2020-05-27 NOTE — Addendum Note (Signed)
Addended by: Tito Dine on: 05/27/2020 09:11 AM   Modules accepted: Orders

## 2020-05-27 NOTE — Progress Notes (Signed)
PALESTINE MOSCO 1976-05-19 [id number]   History:    44 y.o. G4P2A2L2 Married. Daughters 67 yo and 39 yo. Stepson 78 yo.  EX:[id number] presenting for annual gyn exam   UYQ:IHKV on Mirena IUD x1/2018. No BTB. No pelvic pain. H/O LEEP CIN2 in 1999, margins neg. Paps normal since then.No pain with IC. Urine/BMs wnl. Breasts normal. BMI 23.23. Fasting health labs here today.  Cigarette smoking, trying to quit, will try to decrease again.   Past medical history,surgical history, family history and social history were all reviewed and documented in the EPIC chart.  Gynecologic History No LMP recorded (lmp unknown). (Menstrual status: IUD).  Obstetric History OB History  Gravida Para Term Preterm AB Living  '3 2 1 ' 0 1 2  SAB TAB Ectopic Multiple Live Births  1 0 0 0      # Outcome Date GA Lbr Len/2nd Weight Sex Delivery Anes PTL Lv  3 Term 08/06/11 [id number] / 00:03 6 lb 2.1 oz (2.781 kg) F Vag-Spont None    2 Para           1 SAB              ROS: A ROS was performed and pertinent positives and negatives are included in the history.  GENERAL: No fevers or chills. HEENT: No change in vision, no earache, sore throat or sinus congestion. NECK: No pain or stiffness. CARDIOVASCULAR: No chest pain or pressure. No palpitations. PULMONARY: No shortness of breath, cough or wheeze. GASTROINTESTINAL: No abdominal pain, nausea, vomiting or diarrhea, melena or bright red blood per rectum. GENITOURINARY: No urinary frequency, urgency, hesitancy or dysuria. MUSCULOSKELETAL: No joint or muscle pain, no back pain, no recent trauma. DERMATOLOGIC: No rash, no itching, no lesions. ENDOCRINE: No polyuria, polydipsia, no heat or cold intolerance. No recent change in weight. HEMATOLOGICAL: No anemia or easy bruising or bleeding. NEUROLOGIC: No headache, seizures, numbness, tingling or weakness. PSYCHIATRIC: No depression, no loss of interest in normal activity or change in sleep  pattern.     Exam:   BP 122/80 (BP Location: Right Arm, Patient Position: Sitting, Cuff Size: Normal)   Ht '5\' 2"'  (1.575 m)   Wt 127 lb (57.6 kg)   LMP  (LMP Unknown) Comment: spotting  BMI 23.23 kg/m   Body mass index is 23.23 kg/m.  General appearance : Well developed well nourished female. No acute distress HEENT: Eyes: no retinal hemorrhage or exudates,  Neck supple, trachea midline, no carotid bruits, no thyroidmegaly Lungs: Clear to auscultation, no rhonchi or wheezes, or rib retractions  Heart: Regular rate and rhythm, no murmurs or gallops Breast:Examined in sitting and supine position were symmetrical in appearance, no palpable masses or tenderness,  no skin retraction, no nipple inversion, no nipple discharge, no skin discoloration, no axillary or supraclavicular lymphadenopathy Abdomen: no palpable masses or tenderness, no rebound or guarding Extremities: no edema or skin discoloration or tenderness  Pelvic: Vulva: Normal             Vagina: No gross lesions or discharge  Cervix: No gross lesions or discharge. IUD strings visible at the EO. Pap reflex done.  Uterus  RV, normal size, shape and consistency, non-tender and mobile  Adnexa  Without masses or tenderness  Anus: Normal   Assessment/Plan:  44y.o. female for annual exam   1. Encounter for gynecological examination with Papanicolaou smear of cervix Normal gynecologic exam.  Pap reflex done.  Breast exam normal.  Last screening mammogram  November 2020 was negative, screening mammogram scheduled for this year.  Fasting health labs here today.  Good body mass index at 23.23.  Continue with fitness and healthy nutrition. - CBC - Comp Met (CMET) - TSH - Lipid panel - VITAMIN D 25 Hydroxy (Vit-D Deficiency, Fractures)  2. CIN II (cervical intraepithelial neoplasia II) Pap reflex done today.  3. Encounter for routine checking of intrauterine contraceptive device (IUD) Mirena IUD in good position and  well-tolerated.  IUD inserted in January 2018.  4. Smoking trying to quit Encouraged to decrease cigarette smoking and eventually quit.  Princess Bruins MD, 8:25 AM 05/27/2020

## 2020-05-28 LAB — PAP IG W/ RFLX HPV ASCU

## 2020-05-28 LAB — COMPREHENSIVE METABOLIC PANEL
AG Ratio: 2.1 (calc) (ref 1.0–2.5)
ALT: 13 U/L (ref 6–29)
AST: 17 U/L (ref 10–30)
Albumin: 4.7 g/dL (ref 3.6–5.1)
Alkaline phosphatase (APISO): 44 U/L (ref 31–125)
BUN: 12 mg/dL (ref 7–25)
CO2: 27 mmol/L (ref 20–32)
Calcium: 9.6 mg/dL (ref 8.6–10.2)
Chloride: 102 mmol/L (ref 98–110)
Creat: 0.86 mg/dL (ref 0.50–1.10)
Globulin: 2.2 g/dL (calc) (ref 1.9–3.7)
Glucose, Bld: 96 mg/dL (ref 65–99)
Potassium: 4.7 mmol/L (ref 3.5–5.3)
Sodium: 138 mmol/L (ref 135–146)
Total Bilirubin: 1 mg/dL (ref 0.2–1.2)
Total Protein: 6.9 g/dL (ref 6.1–8.1)

## 2020-05-28 LAB — LIPID PANEL
Cholesterol: 176 mg/dL (ref <200)
HDL: 51 mg/dL (ref >=50)
LDL Cholesterol (Calc): 108 mg/dL (calc) — ABNORMAL HIGH
Non-HDL Cholesterol (Calc): 125 mg/dL (calc) (ref <130)
Total CHOL/HDL Ratio: 3.5 (calc) (ref <5.0)
Triglycerides: 84 mg/dL (ref <150)

## 2020-05-28 LAB — CBC
HCT: 47.6 % — ABNORMAL HIGH (ref 35.0–45.0)
Hemoglobin: 15.9 g/dL — ABNORMAL HIGH (ref 11.7–15.5)
MCH: 31.1 pg (ref 27.0–33.0)
MCHC: 33.4 g/dL (ref 32.0–36.0)
MCV: 93.2 fL (ref 80.0–100.0)
MPV: 10.9 fL (ref 7.5–12.5)
Platelets: 144 10*3/uL (ref 140–400)
RBC: 5.11 10*6/uL — ABNORMAL HIGH (ref 3.80–5.10)
RDW: 12 % (ref 11.0–15.0)
WBC: 4.5 10*3/uL (ref 3.8–10.8)

## 2020-05-28 LAB — TSH: TSH: 1.43 mIU/L

## 2020-05-28 LAB — VITAMIN D 25 HYDROXY (VIT D DEFICIENCY, FRACTURES): Vit D, 25-Hydroxy: 33 ng/mL (ref 30–100)

## 2020-06-01 NOTE — Progress Notes (Signed)
[facility] [address], [address] [place], [place] [address] [phone]  Date:  06/04/2020   Name:  Betty Hall   DOB:  [birth year]   MRN:  [id number]  PCP:  Pearline Cables, MD    Chief Complaint: New Patient (Initial Visit) (flu shot, high hemoglobin-discuss labs) and Ear Itching (bilateral ear itching, right ear ringing)   History of Present Illness:  Betty Hall is a 44 y.o. 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  She has worked for [employer] for a long time-about 30 years She has history of polycythemia and has been evaluated by hematology, thought most likely due to tobacco use.  Otherwise she is generally in very good health  She has gynecology care, Dr. Seymour Bars- she saw her just last week, labs are on chart She is married with 2 daughters and adult stepson 63 yo, the girls are 79 and 45 Mirena IUD  Flu vaccine- give today  Covid series is complete Pap up-to-date Mammogram up-to-date  She has frequent ear infections the last year or so- may be due to wearing headphones for phone conferences a lot She had a couple of episodes of chest pain- like a dull ache, may last for 5-15 minutes The CP occurs at rest She has noted this for about 3 months, perhaps 3 episodes overall Last occurred 3 weeks ago She does not feel SOB  She does not do a lot of formal exercise, but she does tend to be physically active-no concern of chest pain with exercise Her GM had a pacemaker Never had a cardiac eval herself  She does smoke - 1 PPD She had cut back but has been under some stress recently and is smoking more She has used chantix and also wellbutrin in the past -the Chantix helped temporarily, but she knows it is not a long-term drug Patient Active  Problem List   Diagnosis Date Noted  . IUD (intrauterine device) in place 09/24/2016  . Smoking 1/2 pack a day or less 12/11/2014  . CIN II (cervical intraepithelial neoplasia II) 11/27/2012  . Galactorrhea 11/27/2012  . Loss of weight 11/27/2012    Past Medical History:  Diagnosis Date  . Hypertension     Past Surgical History:  Procedure Laterality Date  . CERVICAL BIOPSY  W/ LOOP ELECTRODE EXCISION  1999   CIN II  . GALLBLADDER SURGERY  2002  . knee surgeries  2011, 2010, 2009  . KNEE SURGERY Left FEB 2017    Social History   Tobacco Use  . Smoking status: Current Every Day Smoker    Packs/day: 0.50    Types: Cigarettes  . Smokeless tobacco: Never Used  Vaping Use  . Vaping Use: Never used  Substance Use Topics  . Alcohol use: Yes    Alcohol/week: 0.0 standard drinks    Comment: SOCIAL  . Drug use: No    Family History  Problem Relation Age of Onset  . Hypertension Father   . Heart disease Maternal Grandmother   . Diabetes Maternal Grandmother   . Cancer Maternal Grandfather        COLON  . Hypertension Paternal Grandfather     No Known Allergies  Medication list has been reviewed and updated.  Current Outpatient Medications on File Prior to Visit  Medication Sig Dispense Refill  . Ascorbic Acid (VITAMIN C) 1000 MG tablet Take 1,000 mg by mouth daily.    . cholecalciferol (VITAMIN D3) 25 MCG (1000 UNIT) tablet Take 1,000 Units by mouth daily.    Marland Kitchen levonorgestrel (MIRENA) 20 MCG/24HR IUD 1 each by Intrauterine route once.    . Zinc Sulfate (ZINC 15 PO) Take by mouth.     No current facility-administered medications on file prior to visit.    Review of Systems:  As per HPI- otherwise negative.   Physical Examination: Vitals:   06/04/20 1038 06/04/20 1117  BP: (!) 154/90 122/80  Pulse: 87   Resp: 16   SpO2: 95%    Vitals:   06/04/20 1038  Weight: 129 lb (58.5 kg)  Height: 5' 2.5" (1.588 m)   Body mass index is 23.22 kg/m. Ideal Body  Weight: Weight in (lb) to have BMI = 25: 138.6  GEN: no acute distress.  Petite build, looks well HEENT: Atraumatic, Normocephalic.  Ears and Nose: No external deformity. CV: RRR, No M/G/R. No JVD. No thrill. No extra heart sounds. PULM: CTA B, no wheezes, crackles, rhonchi. No retractions. No resp. distress. No accessory muscle use. ABD: S, NT, ND, +BS. No rebound. No HSM. EXTR: No c/c/e PSYCH: Normally interactive. Conversant.   EKG: NSR No old tracings for comparison  BP Readings from Last 3 Encounters:  06/04/20 122/80  05/27/20 122/80  07/16/19 137/90    Assessment and Plan: Chest tightness - Plan: EKG 12-Lead, DG Chest 2 View  Needs flu shot - Plan: Flu Vaccine QUAD 6+ mos PF IM (Fluarix Quad PF)  Encounter for medical examination to establish care   Patient here today to establish primary care She does have gynecology care already Flu vaccine given Recent labs up-to-date, reviewed She does have polycythemia and plans to donate blood seen She has noted a few episodes of atypical chest pain.  EKG today is normal and reassuring.  She will plan to have a chest x-ray as well If she continues to have these episodes will set her up for an exercise stress test.  She will keep me posted She was seek care immediately if pain is different or worse This visit occurred during the SARS-CoV-2 public health emergency.  Safety protocols were in place, including screening questions prior to the visit, additional usage of staff PPE, and extensive cleaning of exam room while observing appropriate contact time as indicated for disinfecting solutions.    Signed Abbe Amsterdam, MD

## 2020-06-04 ENCOUNTER — Encounter: Payer: Self-pay | Admitting: Family Medicine

## 2020-06-04 ENCOUNTER — Ambulatory Visit
Admission: RE | Admit: 2020-06-04 | Discharge: 2020-06-04 | Disposition: A | Discharge status: Home / Self Care | Payer: 59 | Source: Ambulatory Visit | Attending: Family Medicine | Admitting: Family Medicine

## 2020-06-04 ENCOUNTER — Ambulatory Visit: Payer: 59 | Admitting: Family Medicine

## 2020-06-04 ENCOUNTER — Other Ambulatory Visit: Payer: Self-pay

## 2020-06-04 VITALS — BP 122/80 | HR 87 | Resp 16 | Ht 62.5 in | Wt 129.0 lb

## 2020-06-04 DIAGNOSIS — Z0000 Encounter for general adult medical examination without abnormal findings: Secondary | ICD-10-CM

## 2020-06-04 DIAGNOSIS — R0789 Other chest pain: Secondary | ICD-10-CM | POA: Diagnosis not present

## 2020-06-04 DIAGNOSIS — Z23 Encounter for immunization: Secondary | ICD-10-CM

## 2020-06-04 NOTE — Patient Instructions (Addendum)
Good to meet you today!  Please go to [facility] at your convenience for your chest film Your EKG is reassuring- please let me know if you continue to have these episodes of chest tightness   Flu shot given today  Try some sweet oil drops to moisturize your ears

## 2020-07-24 ENCOUNTER — Ambulatory Visit
Admission: RE | Admit: 2020-07-24 | Discharge: 2020-07-24 | Disposition: A | Discharge status: Home / Self Care | Payer: 59 | Source: Ambulatory Visit

## 2020-07-24 ENCOUNTER — Other Ambulatory Visit: Payer: Self-pay

## 2020-07-24 DIAGNOSIS — Z1231 Encounter for screening mammogram for malignant neoplasm of breast: Secondary | ICD-10-CM

## 2020-07-28 ENCOUNTER — Other Ambulatory Visit: Payer: Self-pay | Admitting: Obstetrics & Gynecology

## 2020-07-28 DIAGNOSIS — R928 Other abnormal and inconclusive findings on diagnostic imaging of breast: Secondary | ICD-10-CM

## 2020-07-29 ENCOUNTER — Encounter: Payer: Self-pay | Admitting: Family Medicine

## 2020-08-04 ENCOUNTER — Ambulatory Visit: Payer: 59

## 2020-08-04 ENCOUNTER — Ambulatory Visit
Admission: RE | Admit: 2020-08-04 | Discharge: 2020-08-04 | Disposition: A | Discharge status: Home / Self Care | Payer: 59 | Source: Ambulatory Visit | Attending: Obstetrics & Gynecology | Admitting: Obstetrics & Gynecology

## 2020-08-04 ENCOUNTER — Other Ambulatory Visit: Payer: Self-pay

## 2020-08-04 DIAGNOSIS — R928 Other abnormal and inconclusive findings on diagnostic imaging of breast: Secondary | ICD-10-CM

## 2020-12-08 NOTE — Progress Notes (Addendum)
[facility] [address] [place], [place] [address] [phone]  Date:  12/11/2020   Name:  Betty Hall   DOB:  [birth year]   MRN:  [id number]  PCP:  Pearline Cables, MD    Chief Complaint: Discuss Blood Work   History of Present Illness:  Betty Hall is a 45 y.o. very pleasant female patient who presents with the following:  Here today for a follow-up and labs Last seen by myself in September  She has history of polycythemia and has been evaluated by hematology, thought most likely due to tobacco use.  Otherwise she is generally in very good health  She has gynecology care, Dr. Seymour Bars  Most recent routine labs September 2021- hg at that time 15.9  She tried to donate blood but it was not successful for her - the blood did not seem to flow properly and eventually she had to abort the donation  She has not had a cig in about a month- she does use a vape on occasion but does not like it as much Her husband quit smoking too!   Her oldest child is graduating from high school this year  She has an IUD and does not menstruate She had her covid booster Patient Active Problem List   Diagnosis Date Noted  . IUD (intrauterine device) in place 09/24/2016  . Smoking 1/2 pack a day or less 12/11/2014  . CIN II (cervical intraepithelial neoplasia II) 11/27/2012  . Galactorrhea 11/27/2012  . Loss of weight 11/27/2012    Past Medical History:  Diagnosis Date  . Hypertension     Past Surgical History:  Procedure Laterality Date  . CERVICAL BIOPSY  W/ LOOP ELECTRODE EXCISION  1999   CIN II  . GALLBLADDER SURGERY  2002  . knee surgeries  2011, 2010, 2009  . KNEE SURGERY Left FEB 2017    Social History   Tobacco Use  . Smoking status: Current Every Day Smoker    Packs/day: 0.50    Types: Cigarettes  . Smokeless tobacco: Never Used  Vaping Use  . Vaping Use: Never used  Substance Use Topics  .  Alcohol use: Yes    Alcohol/week: 0.0 standard drinks    Comment: SOCIAL  . Drug use: No    Family History  Problem Relation Age of Onset  . Hypertension Father   . Heart disease Maternal Grandmother   . Diabetes Maternal Grandmother   . Cancer Maternal Grandfather        COLON  . Hypertension Paternal Grandfather     No Known Allergies  Medication list has been reviewed and updated.  Current Outpatient Medications on File Prior to Visit  Medication Sig Dispense Refill  . Ascorbic Acid (VITAMIN C) 1000 MG tablet Take 1,000 mg by mouth daily.    . cholecalciferol (VITAMIN D3) 25 MCG (1000 UNIT) tablet Take 1,000 Units by mouth daily.    Marland Kitchen levonorgestrel (MIRENA) 20 MCG/24HR IUD 1 each by Intrauterine route once.    . Zinc Sulfate (ZINC 15 PO) Take by mouth.     No current facility-administered medications on file prior to visit.    Review of Systems:  As per HPI- otherwise negative.   Physical Examination: Vitals:   12/11/20 1435  BP: 122/80  Pulse: 69  Resp: 17  SpO2: 96%   Vitals:   12/11/20 1435  Weight: 129 lb (58.5 kg)  Height: 5'  2.5" (1.588 m)   Body mass index is 23.22 kg/m. Ideal Body Weight: Weight in (lb) to have BMI = 25: 138.6  GEN: no acute distress.  Petite build, normal weight.  Looks well HEENT: Atraumatic, Normocephalic.  Ears and Nose: No external deformity. CV: RRR, No M/G/R. No JVD. No thrill. No extra heart sounds. PULM: CTA B, no wheezes, crackles, rhonchi. No retractions. No resp. distress. No accessory muscle use. ABD: S, NT, ND, +BS. No rebound. No HSM. EXTR: No c/c/e PSYCH: Normally interactive. Conversant.    Assessment and Plan: Polycythemia - Plan: Ferritin, CBC, Basic metabolic panel  Exposure to COVID-19 virus - Plan: SAR CoV2 Serology (COVID 19)AB(IGG)IA  Screening for diabetes mellitus - Plan: Hemoglobin A1c  History of polycythemia.  Will check CBC, ferritin today. A1c not done recently, will screen for diabetes  today The patient is interested in having COVID-19 antibody level to make sure she is protected.  We will do this for her today Will plan further follow- up pending labs. Congratulated her on smoking cessation, she is also working on cutting down on her vape use This visit occurred during the SARS-CoV-2 public health emergency.  Safety protocols were in place, including screening questions prior to the visit, additional usage of staff PPE, and extensive cleaning of exam room while observing appropriate contact time as indicated for disinfecting solutions.     Signed Abbe Amsterdam, MD  Received her labs as below 4/1- message to pt   Results for orders placed or performed in visit on 12/11/20  Ferritin  Result Value Ref Range   Ferritin 151.0 10.0 - 291.0 ng/mL  CBC  Result Value Ref Range   WBC 5.0 4.0 - 10.5 K/uL   RBC 4.60 3.87 - 5.11 Mil/uL   Platelets 149.0 (L) 150.0 - 400.0 K/uL   Hemoglobin 14.4 12.0 - 15.0 g/dL   HCT 58.0 99.8 - 33.8 %   MCV 90.0 78.0 - 100.0 fl   MCHC 34.7 30.0 - 36.0 g/dL   RDW 25.0 53.9 - 76.7 %  Basic metabolic panel  Result Value Ref Range   Sodium 140 135 - 145 mEq/L   Potassium 4.0 3.5 - 5.1 mEq/L   Chloride 103 96 - 112 mEq/L   CO2 28 19 - 32 mEq/L   Glucose, Bld 86 70 - 99 mg/dL   BUN 12 6 - 23 mg/dL   Creatinine, Ser 3.41 0.40 - 1.20 mg/dL   GFR 93.79 >02.40 mL/min   Calcium 9.7 8.4 - 10.5 mg/dL  Hemoglobin X7D  Result Value Ref Range   Hgb A1c MFr Bld 5.4 4.6 - 6.5 %  SARS-CoV-2 Antibody(IgG)Spike,Semi-Quantitative  Result Value Ref Range   SARS COV1 AB(IGG)SPIKE,SEMI QN 17.80 (H) <1.00 index

## 2020-12-08 NOTE — Patient Instructions (Addendum)
It was great to see you again today - I will be in touch with your labs asap [place] job with giving up cigarettes!  That is terrific!

## 2020-12-11 ENCOUNTER — Ambulatory Visit: Payer: 59 | Admitting: Family Medicine

## 2020-12-11 ENCOUNTER — Other Ambulatory Visit: Payer: Self-pay

## 2020-12-11 ENCOUNTER — Encounter: Payer: Self-pay | Admitting: Family Medicine

## 2020-12-11 VITALS — BP 122/80 | HR 69 | Resp 17 | Ht 62.5 in | Wt 129.0 lb

## 2020-12-11 DIAGNOSIS — Z131 Encounter for screening for diabetes mellitus: Secondary | ICD-10-CM

## 2020-12-11 DIAGNOSIS — D751 Secondary polycythemia: Secondary | ICD-10-CM | POA: Diagnosis not present

## 2020-12-11 DIAGNOSIS — Z20822 Contact with and (suspected) exposure to covid-19: Secondary | ICD-10-CM

## 2020-12-12 ENCOUNTER — Encounter: Payer: Self-pay | Admitting: Family Medicine

## 2020-12-12 LAB — BASIC METABOLIC PANEL
BUN: 12 mg/dL (ref 6–23)
CO2: 28 mEq/L (ref 19–32)
Calcium: 9.7 mg/dL (ref 8.4–10.5)
Chloride: 103 mEq/L (ref 96–112)
Creatinine, Ser: 0.89 mg/dL (ref 0.40–1.20)
GFR: 79.36 mL/min (ref >60.00)
Glucose, Bld: 86 mg/dL (ref 70–99)
Potassium: 4 mEq/L (ref 3.5–5.1)
Sodium: 140 mEq/L (ref 135–145)

## 2020-12-12 LAB — FERRITIN: Ferritin: 151 ng/mL (ref 10.0–291.0)

## 2020-12-12 LAB — CBC
HCT: 41.4 % (ref 36.0–46.0)
Hemoglobin: 14.4 g/dL (ref 12.0–15.0)
MCHC: 34.7 g/dL (ref 30.0–36.0)
MCV: 90 fl (ref 78.0–100.0)
Platelets: 149 10*3/uL — ABNORMAL LOW (ref 150.0–400.0)
RBC: 4.6 Mil/uL (ref 3.87–5.11)
RDW: 12.5 % (ref 11.5–15.5)
WBC: 5 10*3/uL (ref 4.0–10.5)

## 2020-12-12 LAB — HEMOGLOBIN A1C: Hgb A1c MFr Bld: 5.4 % (ref 4.6–6.5)

## 2020-12-12 LAB — SARS-COV-2 ANTIBODY(IGG)SPIKE,SEMI-QUANTITATIVE: SARS COV1 AB(IGG)SPIKE,SEMI QN: 17.8 index — ABNORMAL HIGH (ref <1.00)

## 2021-01-16 ENCOUNTER — Encounter: Payer: Self-pay | Admitting: Family Medicine

## 2021-04-12 NOTE — Progress Notes (Signed)
[facility] at [facility] [address] [place], [place] [address] [phone]  Date:  04/13/2021   Name:  Betty Hall   DOB:  [birth year]   MRN:  [id number]  PCP:  Pearline Cables, MD    Chief Complaint: Back Pain (Pain runs down right leg/)   History of Present Illness:  Betty Hall is a 45 y.o. very pleasant female patient who presents with the following:  Patient seen today with back pain radiating down her right leg Most recent visit with myself was in March History of polycythemia status post hematology evaluation, thought due to smoking Gynecology care per [facility]  Lab update done in March  About 10 days ago she moved some heavy items, and then developed some pain in her right lower back and into her back and feet.  The pain started the day after she did the heavy lifting She used aleve last week- this generally will help with aches and pains  She is also using an ice pack   She notes pain in her mid right back, and a stabbing pain in her glute, down the back of her leg and into her foot  She is having some numbness in her toes  No bowel or bladder in continence  She feels like her right leg is weaker than normal  Patient Active Problem List   Diagnosis Date Noted   IUD (intrauterine device) in place 09/24/2016   Smoking 1/2 pack a day or less 12/11/2014   CIN II (cervical intraepithelial neoplasia II) 11/27/2012   Galactorrhea 11/27/2012   Loss of weight 11/27/2012    Past Medical History:  Diagnosis Date   Hypertension     Past Surgical History:  Procedure Laterality Date   CERVICAL BIOPSY  W/ LOOP ELECTRODE EXCISION  1999   CIN II   GALLBLADDER SURGERY  2002   knee surgeries  2011, 2010, 2009   KNEE SURGERY Left FEB 2017    Social History   Tobacco Use   Smoking status: Every Day    Packs/day: 0.50    Types: Cigarettes   Smokeless tobacco: Never  Vaping Use   Vaping Use: Never used   Substance Use Topics   Alcohol use: Yes    Alcohol/week: 0.0 standard drinks    Comment: SOCIAL   Drug use: No    Family History  Problem Relation Age of Onset   Hypertension Father    Heart disease Maternal Grandmother    Diabetes Maternal Grandmother    Cancer Maternal Grandfather        COLON   Hypertension Paternal Grandfather     No Known Allergies  Medication list has been reviewed and updated.  Current Outpatient Medications on File Prior to Visit  Medication Sig Dispense Refill   Ascorbic Acid (VITAMIN C) 1000 MG tablet Take 1,000 mg by mouth daily.     cholecalciferol (VITAMIN D3) 25 MCG (1000 UNIT) tablet Take 1,000 Units by mouth daily.     levonorgestrel (MIRENA) 20 MCG/24HR IUD 1 each by Intrauterine route once.     Zinc Sulfate (ZINC 15 PO) Take by mouth.     No current facility-administered medications on file prior to visit.    Review of Systems:  As per HPI- otherwise negative.   Physical Examination: Vitals:   04/13/21 1257  BP: 140/86  Pulse: 65  Resp: 12  Temp: 97.9 F (36.6 C)  SpO2: 98%  Vitals:   04/13/21 1257  Weight: 135 lb (61.2 kg)  Height: 5' 2.5" (1.588 m)   Body mass index is 24.3 kg/m. Ideal Body Weight: Weight in (lb) to have BMI = 25: 138.6  GEN: no acute distress.  Looks well, normal weight  HEENT: Atraumatic, Normocephalic.  Ears and Nose: No external deformity. CV: RRR, No M/G/R. No JVD. No thrill. No extra heart sounds. PULM: CTA B, no wheezes, crackles, rhonchi. No retractions. No resp. distress. No accessory muscle use. EXTR: No c/c/e PSYCH: Normally interactive. Conversant.  Tenderness over right sciatic notch as well as some tender spasm in the right thoracic paraspinous musculature No redness or swelling Normal bilateral lower extremity strength, sensation, deep tendon reflex.  Negative straight leg raise   Assessment and Plan: Acute back pain with sciatica, right - Plan: cyclobenzaprine (FLEXERIL) 10 MG  tablet, predniSONE (DELTASONE) 20 MG tablet  Seen today with acute lower back pain, she seems to have both muscle spasm as well as sciatica.  She describes pain running down her leg, but straight leg raise is negative.  We will treat her with Flexeril, caution regarding sedation.  Also prednisone for 10 days.  Avoid NSAIDs while on prednisone.  Encouraged gentle stretching, heat or ice as needed  She will let us know if not feeling better next few days, sooner if any worse This visit occurred during the SARS-CoV-2 public health emergency.  Safety protocols were in place, including screening questions prior to the visit, additional usage of staff PPE, and extensive cleaning of exam room while observing appropriate contact time as indicated for disinfecting solutions.   Signed Abbe Amsterdam, MD

## 2021-04-13 ENCOUNTER — Encounter: Payer: Self-pay | Admitting: Family Medicine

## 2021-04-13 ENCOUNTER — Other Ambulatory Visit: Payer: Self-pay

## 2021-04-13 ENCOUNTER — Ambulatory Visit: Payer: 59 | Admitting: Family Medicine

## 2021-04-13 VITALS — BP 140/86 | HR 65 | Temp 97.9°F | Resp 12 | Ht 62.5 in | Wt 135.0 lb

## 2021-04-13 DIAGNOSIS — M5441 Lumbago with sciatica, right side: Secondary | ICD-10-CM | POA: Diagnosis not present

## 2021-04-13 MED ORDER — CYCLOBENZAPRINE HCL 10 MG PO TABS: 10.0000 mg | ORAL_TABLET | Freq: Two times a day (BID) | ORAL | 0 refills | Status: DC | PRN

## 2021-04-13 MED ORDER — PREDNISONE 20 MG PO TABS: ORAL_TABLET | ORAL | 0 refills | Status: DC

## 2021-04-13 NOTE — Patient Instructions (Signed)
Good to see you! I am sorry that your back is hurting.  Please use the cyclobenzaprine/Flexeril as needed, this is your muscle relaxer.  This will make you drowsy so do not use it when you are driving.  In addition, use the prednisone as directed.  This is a steroid that should help with swelling and any nerve pinch that has occurred!  If you are not feeling better in the next 2 to 3 days please let me know, sooner if getting worse

## 2021-04-14 ENCOUNTER — Other Ambulatory Visit: Payer: Self-pay | Admitting: Family

## 2021-04-14 ENCOUNTER — Telehealth: Payer: Self-pay

## 2021-04-14 DIAGNOSIS — M5441 Lumbago with sciatica, right side: Secondary | ICD-10-CM

## 2021-04-14 MED ORDER — PREDNISONE 20 MG PO TABS
ORAL_TABLET | ORAL | 0 refills | Status: DC
Start: 2021-04-14 — End: 2021-04-30

## 2021-04-14 NOTE — Telephone Encounter (Signed)
Could you look at the prednisone script?

## 2021-04-15 ENCOUNTER — Other Ambulatory Visit: Payer: Self-pay

## 2021-04-15 ENCOUNTER — Emergency Department (HOSPITAL_BASED_OUTPATIENT_CLINIC_OR_DEPARTMENT_OTHER): Payer: 59

## 2021-04-15 ENCOUNTER — Encounter: Payer: Self-pay | Admitting: Family Medicine

## 2021-04-15 ENCOUNTER — Emergency Department (HOSPITAL_BASED_OUTPATIENT_CLINIC_OR_DEPARTMENT_OTHER)
Admission: EM | Admit: 2021-04-15 | Discharge: 2021-04-16 | Disposition: A | Discharge status: Home / Self Care | Payer: 59 | Attending: Emergency Medicine | Admitting: Emergency Medicine

## 2021-04-15 DIAGNOSIS — I10 Essential (primary) hypertension: Secondary | ICD-10-CM | POA: Insufficient documentation

## 2021-04-15 DIAGNOSIS — R4789 Other speech disturbances: Secondary | ICD-10-CM | POA: Diagnosis not present

## 2021-04-15 DIAGNOSIS — F17210 Nicotine dependence, cigarettes, uncomplicated: Secondary | ICD-10-CM | POA: Diagnosis not present

## 2021-04-15 DIAGNOSIS — R479 Unspecified speech disturbances: Secondary | ICD-10-CM

## 2021-04-15 DIAGNOSIS — R200 Anesthesia of skin: Secondary | ICD-10-CM

## 2021-04-15 DIAGNOSIS — R202 Paresthesia of skin: Secondary | ICD-10-CM | POA: Diagnosis not present

## 2021-04-15 LAB — BASIC METABOLIC PANEL
Anion gap: 13 (ref 5–15)
BUN: 13 mg/dL (ref 6–20)
CO2: 24 mmol/L (ref 22–32)
Calcium: 9.8 mg/dL (ref 8.9–10.3)
Chloride: 104 mmol/L (ref 98–111)
Creatinine, Ser: 0.83 mg/dL (ref 0.44–1.00)
GFR, Estimated: >60 mL/min (ref >60)
Glucose, Bld: 117 mg/dL — ABNORMAL HIGH (ref 70–99)
Potassium: 4.5 mmol/L (ref 3.5–5.1)
Sodium: 141 mmol/L (ref 135–145)

## 2021-04-15 LAB — URINALYSIS, ROUTINE W REFLEX MICROSCOPIC
Bilirubin Urine: NEGATIVE
Glucose, UA: NEGATIVE mg/dL
Hgb urine dipstick: NEGATIVE
Ketones, ur: NEGATIVE mg/dL
Leukocytes,Ua: NEGATIVE
Nitrite: NEGATIVE
Protein, ur: NEGATIVE mg/dL
Specific Gravity, Urine: 1.02 (ref 1.005–1.030)
pH: 5.5 (ref 5.0–8.0)

## 2021-04-15 LAB — CBC
HCT: 43.5 % (ref 36.0–46.0)
Hemoglobin: 15 g/dL (ref 12.0–15.0)
MCH: 30.6 pg (ref 26.0–34.0)
MCHC: 34.5 g/dL (ref 30.0–36.0)
MCV: 88.8 fL (ref 80.0–100.0)
Platelets: 174 10*3/uL (ref 150–400)
RBC: 4.9 MIL/uL (ref 3.87–5.11)
RDW: 12.5 % (ref 11.5–15.5)
WBC: 5.8 10*3/uL (ref 4.0–10.5)
nRBC: 0 % (ref 0.0–0.2)

## 2021-04-15 LAB — PREGNANCY, URINE: Preg Test, Ur: NEGATIVE

## 2021-04-15 LAB — CBG MONITORING, ED: Glucose-Capillary: 99 mg/dL (ref 70–99)

## 2021-04-15 NOTE — ED Notes (Signed)
Tingling first noticed on Friday that started in her pinky toe and has gotten worse and reports she has back pain

## 2021-04-15 NOTE — ED Notes (Signed)
Going ED to ED POV for an MRI

## 2021-04-15 NOTE — ED Provider Notes (Signed)
[facility] EMERGENCY DEPT Provider Note   CSN: [id number] Arrival date & time: 04/15/21  1648     History Chief Complaint  Patient presents with   Tingling    Betty Hall is a 45 y.o. female.  The history is provided by the patient and medical records. No language interpreter was used.  Neurologic Problem This is a new problem. The current episode started more than 1 week ago. The problem occurs constantly. Progression since onset: waxing and waning. Pertinent negatives include no chest pain, no abdominal pain, no headaches and no shortness of breath. Nothing aggravates the symptoms. Nothing relieves the symptoms. She has tried nothing for the symptoms. The treatment provided no relief.      Past Medical History:  Diagnosis Date   Hypertension     Patient Active Problem List   Diagnosis Date Noted   IUD (intrauterine device) in place 09/24/2016   Smoking 1/2 pack a day or less 12/11/2014   CIN II (cervical intraepithelial neoplasia II) 11/27/2012   Galactorrhea 11/27/2012   Loss of weight 11/27/2012    Past Surgical History:  Procedure Laterality Date   CERVICAL BIOPSY  W/ LOOP ELECTRODE EXCISION  1999   CIN II   GALLBLADDER SURGERY  2002   knee surgeries  2011, 2010, 2009   KNEE SURGERY Left FEB 2017     OB History     Gravida  3   Para  2   Term  1   Preterm  0   AB  1   Living  2      SAB  1   IAB  0   Ectopic  0   Multiple  0   Live Births              Family History  Problem Relation Age of Onset   Hypertension Father    Heart disease Maternal Grandmother    Diabetes Maternal Grandmother    Cancer Maternal Grandfather        COLON   Hypertension Paternal Grandfather     Social History   Tobacco Use   Smoking status: Every Day    Packs/day: 0.50    Types: Cigarettes   Smokeless tobacco: Never  Vaping Use   Vaping Use: Never used  Substance Use Topics   Alcohol use: Yes    Alcohol/week: 0.0 standard  drinks    Comment: SOCIAL   Drug use: No    Home Medications Prior to Admission medications   Medication Sig Start Date End Date Taking? Authorizing Provider  Ascorbic Acid (VITAMIN C) 1000 MG tablet Take 1,000 mg by mouth daily.    [provider]  cholecalciferol (VITAMIN D3) 25 MCG (1000 UNIT) tablet Take 1,000 Units by mouth daily.    [provider]  cyclobenzaprine (FLEXERIL) 10 MG tablet Take 1 tablet (10 mg total) by mouth 2 (two) times daily as needed for muscle spasms. 04/13/21   Copland, Gwenlyn Found, MD  levonorgestrel (MIRENA) 20 MCG/24HR IUD 1 each by Intrauterine route once.    [provider]  predniSONE (DELTASONE) 20 MG tablet Take 40 mg daily for 5 days, then 20 mg daily for 5 days 04/14/21   Olive Bass, FNP  Zinc Sulfate (ZINC 15 PO) Take by mouth.    [provider]    Allergies    Patient has no known allergies.  Review of Systems   Review of Systems  Constitutional:  Negative for chills, diaphoresis, fatigue  and fever.  HENT:  Negative for congestion.   Eyes:  Negative for visual disturbance.  Respiratory:  Negative for cough and shortness of breath.   Cardiovascular:  Negative for chest pain.  Gastrointestinal:  Negative for abdominal pain.  Genitourinary:  Negative for dysuria.  Musculoskeletal:  Negative for back pain.  Neurological:  Positive for speech difficulty, weakness and numbness. Negative for dizziness, syncope, light-headedness and headaches.  Psychiatric/Behavioral:  Negative for agitation.   All other systems reviewed and are negative.  Physical Exam Updated Vital Signs BP (!) 149/97   Pulse 62   Temp 98.3 F (36.8 C) (Oral)   Resp 19   Ht 5' 2.5" (1.588 m)   Wt 61.2 kg   SpO2 100%   BMI 24.30 kg/m   Physical Exam Vitals and nursing note reviewed.  Constitutional:      General: She is not in acute distress.    Appearance: She is well-developed. She is not ill-appearing, toxic-appearing or  diaphoretic.  HENT:     Head: Normocephalic and atraumatic.     Nose: Nose normal.     Mouth/Throat:     Mouth: Mucous membranes are moist.  Eyes:     Conjunctiva/sclera: Conjunctivae normal.  Neck:     Vascular: No carotid bruit.  Cardiovascular:     Rate and Rhythm: Normal rate and regular rhythm.     Heart sounds: No murmur heard. Pulmonary:     Effort: Pulmonary effort is normal. No respiratory distress.     Breath sounds: Normal breath sounds. No wheezing, rhonchi or rales.  Chest:     Chest wall: No tenderness.  Abdominal:     General: Abdomen is flat.     Palpations: Abdomen is soft.     Tenderness: There is no abdominal tenderness.  Musculoskeletal:        General: No tenderness.     Cervical back: Neck supple.     Right lower leg: No edema.     Left lower leg: No edema.  Skin:    General: Skin is warm and dry.     Findings: No erythema.  Neurological:     Mental Status: She is alert.     Sensory: Sensory deficit present.     Motor: No weakness.     Coordination: Coordination normal.  Psychiatric:        Mood and Affect: Mood normal.    ED Results / Procedures / Treatments   Labs (all labs ordered are listed, but only abnormal results are displayed) Labs Reviewed  BASIC METABOLIC PANEL - Abnormal; Notable for the following components:      Result Value   Glucose, Bld 117 (*)    All other components within normal limits  CBC  URINALYSIS, ROUTINE W REFLEX MICROSCOPIC  PREGNANCY, URINE  CBG MONITORING, ED    EKG None  Radiology CT HEAD WO CONTRAST  Result Date: 04/15/2021 CLINICAL DATA:  Maxillofacial pain. Additional history provided: Patient reports numbness in toes and left arm, feeling of heaviness in right face, tenderness in jaw. EXAM: CT HEAD WITHOUT CONTRAST TECHNIQUE: Contiguous axial images were obtained from the base of the skull through the vertex without intravenous contrast. COMPARISON:  No pertinent prior exams available for comparison.  FINDINGS: Brain: Cerebral volume is normal. There is no acute intracranial hemorrhage. No demarcated cortical infarct. No extra-axial fluid collection. No evidence of an intracranial mass. No midline shift. Vascular: No hyperdense vessel. Skull: Normal. Negative for fracture or focal lesion. Sinuses/Orbits: Visualized  orbits show no acute finding. Trace mucosal thickening within the right ethmoid air cells. IMPRESSION: Unremarkable non-contrast CT appearance of the brain. No evidence of acute intracranial abnormality. Trace right ethmoid sinus mucosal thickening. Electronically Signed   By: Jackey LogeKyle  Golden DO   On: 04/15/2021 17:55    Procedures Procedures   Medications Ordered in ED Medications  gadobutrol (GADAVIST) 1 MMOL/ML injection 6.5 mL (6.5 mLs Intravenous Contrast Given 04/16/21 0118)    ED Course  I have reviewed the triage vital signs and the nursing notes.  Pertinent labs & imaging results that were available during my care of the patient were reviewed by me and considered in my medical decision making (see chart for details).    MDM Rules/Calculators/A&P                           Fonnie Jarviseresa M Shreeve is a 45 y.o. female with a past medical history significant for hypertension but has been off of blood pressure medicine for around 12 years who presents at the direction of her PCP for further evaluation of neurologic abnormalities.  According to patient, for the last week and a half, she has been having waxing and waning neurologic complaints including right fourth and fifth toe numbness, waxing and waning right leg weakness, and then several days of right fourth and fifth numbness, right hand grip strength weakness intermittently, and 1 day of right face tingling and numbness.  Patient also reports has been having some headaches and then pain in her right low back going down her right leg.  Patient initially saw her PCP who was concerned about a radicular type pain in her right low back going  down her leg.  She was started on steroids and muscle relaxants but has not had any relief.  She then has since developed the symptoms in her right hand and then today had symptoms in the right face.  She also suspected her voice sounded different intermittently.  She denies any vision changes, nausea, or vomiting.  Denies any fevers, chills, congestion, cough, palpitations, abdominal pain, or urinary changes.  On exam, patient does report tingling in the right fourth and fifth toes, right fourth and fifth fingers, and some tingling in the right face.  There is no facial droop seen.  Clear speech for me.  Pupil symmetric reactive normal extract movements.  Normal finger-nose-finger testing bilaterally.  Normal strength in the legs.  Tenderness present in the right low back but no midline tenderness or other tenderness.  Normal neck range of motion.  Patient otherwise well-appearing.  Patient had some work-up in triage including a CT head which did not show acute intracranial abnormality and some blood work.   Based on the patient's symptoms I am somewhat concerned about possible left-sided thalamic stroke versus other cause of the right-sided deficits waxing and waning.  I spoke with Dr. Onalee HuaMcNeil Kirkpatrick who does recommend she needs ED to ED transfer for MRI brain with and without to look for stroke versus demyelinating abnormality.  If abnormalities are discovered, patient will likely need admission for stroke.  Also, her blood pressure has been more elevated up to 200 on arrival but is now down to around 150 and 140 systolic.  Patient reports she is not on any blood pressure medication.  If blood pressure remains elevated and her symptoms are persistent, patient may need admission for hypertensive emergency causing these neurologic symptoms.  Patient otherwise is now well-appearing and  would like to go POV.  As her symptoms have been going on for nearly a week intermittently, we feel this is reasonable.   Patient will go POV and was accepted by Dr. Consuella Lose in the emergency department for evaluation.  Anticipate neurology consultation when imaging and work-up is completed.   Final Clinical Impression(s) / ED Diagnoses Final diagnoses:  Numbness and tingling  Transient speech disturbance  Numbness and tingling of right face     Clinical Impression: 1. Numbness and tingling   2. Transient speech disturbance   3. Numbness and tingling of right face     Disposition: Transfer ED to ED for MRI and likely neurology evaluation.  This note was prepared with assistance of Conservation officer, historic buildings. Occasional wrong-word or sound-a-like substitutions may have occurred due to the inherent limitations of voice recognition software.     Trelyn Vanderlinde, Canary Brim, MD 04/16/21 [phone]

## 2021-04-15 NOTE — ED Triage Notes (Signed)
Patient reports to the ER for having numbness and in the toes and left arm. Patient reports the right side of her face feels heavy. Patient was treated for a pinched sciatic nerve recently. Patient reports tenderness in her jaw

## 2021-04-16 ENCOUNTER — Emergency Department (HOSPITAL_COMMUNITY): Payer: 59

## 2021-04-16 MED ORDER — GADOBUTROL 1 MMOL/ML IV SOLN
6.5000 mL | Freq: Once | INTRAVENOUS | Status: AC | PRN
  Administered 2021-04-16: 6.5 mL via INTRAVENOUS

## 2021-04-16 NOTE — ED Provider Notes (Signed)
Patient received in transfer from [facility] ED after being evaluated by Dr. Rush Landmark for paresthesias and weakness. Per Dr. Peggye Pitt note:  "According to patient, for the last week and a half, she has been having waxing and waning neurologic complaints including right fourth and fifth toe numbness, waxing and waning right leg weakness, and then several days of right fourth and fifth numbness, right hand grip strength weakness intermittently, and 1 day of right face tingling and numbness.  Patient also reports has been having some headaches and then pain in her right low back going down her right leg.  Patient initially saw her PCP who was concerned about a radicular type pain in her right low back going down her leg.  She was started on steroids and muscle relaxants but has not had any relief.  She then has since developed the symptoms in her right hand and then today had symptoms in the right face.  She also suspected her voice sounded different intermittently.  She denies any vision changes, nausea, or vomiting.  Denies any fevers, chills, congestion, cough, palpitations, abdominal pain, or urinary changes."  On arrival, VSS, she appears comfortable. Has no needs at this time. MRI pending. She denies h/o claustrophobia or need for pre-MRI medication.   2:30 - MRI reviewed and is negative for any acute finding. These results were relayed to Dr. Onalee Hua, neuro. No further work up recommended.   Patient updated on results and pending discharge. All questions answered.    Elpidio Anis, PA-C 04/16/21 0236    Glynn Octave, MD 04/16/21 [phone]

## 2021-04-16 NOTE — Discharge Instructions (Addendum)
Your MRI results are totally normal and you can be discharged home. Please plan to follow up with your doctor in the outpatient setting for any further recommended evaluation.   Return to the ED with any new or concerning symptoms.

## 2021-04-30 ENCOUNTER — Encounter: Payer: Self-pay | Admitting: Neurology

## 2021-04-30 ENCOUNTER — Ambulatory Visit (INDEPENDENT_AMBULATORY_CARE_PROVIDER_SITE_OTHER): Payer: 59 | Admitting: Neurology

## 2021-04-30 VITALS — BP 167/91 | HR 99 | Ht 62.5 in | Wt 133.0 lb

## 2021-04-30 DIAGNOSIS — R202 Paresthesia of skin: Secondary | ICD-10-CM

## 2021-04-30 NOTE — Progress Notes (Signed)
Chief Complaint  Patient presents with   New Patient (Initial Visit)    New patient:  Experienced numbness/tingling on right lower back, leg, arm, face and foot. New Room, alone in room      ASSESSMENT AND PLAN  Betty Hall is a 45 y.o. female   Right paresthesia, including face, upper and lower extremity  Normal MRI of brain  She desired further evaluation, proceed with MRI of cervical spine to rule out cervical pathology  DIAGNOSTIC DATA (LABS, IMAGING, TESTING) - I reviewed patient records, labs, notes, testing and imaging myself where available.  Labs in August 2022, normral CBC, ferritin, bmp, A1C   MEDICAL HISTORY:  Betty Hall is a 45 year old female, seen in request by her primary care physician Dr.   Abbe Amsterdam C, for evaluation of right-sided paresthesia, initial evaluation was on April 30, 2021  I reviewed and summarized the referring note. PMHX.  She lifted a heavy grill on April 04, 2021,  2 days later, on April 06, 2021, when she was stepping on the boat, holding onto the ladder, she felt mild right-sided weakness, but unsecure pulling with right hand and putting her weight on her right foot  She developed right-sided low back pain on April 07, 2021, she described right paraspinal muscle spasm, radiating pain to right lower extremity  By April 10, 2021, she also complains of right arm and elbow discomfort, right fourth and fifth fingers paresthesia  She was seen by Dr. Jeanella Craze, was given a dose of prednisone, which did help her low back pain, and the joints pain,  But around April 13, 2021, she began to notice numbness involving right face, right arm, and right lower extremity, she denies weakness on the right side, was evaluated at emergency room on April 14, 2021, when she was most symptomatic, she felt there was a line drawing through her mid session, right side felt different from the left side  At emergency room, her blood pressure was elevated  150/100, I personally reviewed MRI of the brain with without contrast April 16, 2021 that was normal  She continues to feel intermittent neck strain, sensitive to touch at the posterior neck,    PHYSICAL EXAM:   Vitals:   04/30/21 1349  BP: (!) 167/91  Pulse: 99  Weight: 133 lb (60.3 kg)  Height: 5' 2.5" (1.588 m)   Not recorded     Body mass index is 23.94 kg/m.  PHYSICAL EXAMNIATION:  Gen: NAD, conversant, well nourised, well groomed                     Cardiovascular: Regular rate rhythm, no peripheral edema, warm, nontender. Eyes: Conjunctivae clear without exudates or hemorrhage Neck: Supple, no carotid bruits. Pulmonary: Clear to auscultation bilaterally   NEUROLOGICAL EXAM:  MENTAL STATUS: Speech:    Speech is normal; fluent and spontaneous with normal comprehension.  Cognition:     Orientation to time, place and person     Normal recent and remote memory     Normal Attention span and concentration     Normal Language, naming, repeating,spontaneous speech     Fund of knowledge   CRANIAL NERVES: CN II: Visual fields are full to confrontation. Pupils are round equal and briskly reactive to light. CN III, IV, VI: extraocular movement are normal. No ptosis. CN V: Facial sensation is intact to light touch, but she reported difference in vibratory sensation across forehead CN VII: Face is symmetric with normal  eye closure  CN VIII: Hearing is normal to causal conversation. CN IX, X: Phonation is normal. CN XI: Head turning and shoulder shrug are intact  MOTOR: There is no pronator drift of out-stretched arms. Muscle bulk and tone are normal. Muscle strength is normal.  REFLEXES: Reflexes are 2+ and symmetric at the biceps, triceps, knees, and ankles. Plantar responses are flexor.  SENSORY: Intact to light touch, pinprick and vibratory sensation are intact in fingers and toes.  COORDINATION: There is no trunk or limb dysmetria  noted.  GAIT/STANCE: Posture is normal. Gait is steady with normal steps, base, arm swing, and turning. Heel and toe walking are normal. Tandem gait is normal.  Romberg is absent.  REVIEW OF SYSTEMS:  Full 14 system review of systems performed and notable only for as above All other review of systems were negative.   ALLERGIES: No Known Allergies  HOME MEDICATIONS: Current Outpatient Medications  Medication Sig Dispense Refill   levonorgestrel (MIRENA) 20 MCG/24HR IUD 1 each by Intrauterine route once.     No current facility-administered medications for this visit.    PAST MEDICAL HISTORY: Past Medical History:  Diagnosis Date   Hypertension     PAST SURGICAL HISTORY: Past Surgical History:  Procedure Laterality Date   CERVICAL BIOPSY  W/ LOOP ELECTRODE EXCISION  1999   CIN II   GALLBLADDER SURGERY  2002   knee surgeries  2011, 2010, 2009   KNEE SURGERY Left FEB 2017    FAMILY HISTORY: Family History  Problem Relation Age of Onset   Hypertension Father    Heart disease Maternal Grandmother    Diabetes Maternal Grandmother    Cancer Maternal Grandfather        COLON   Hypertension Paternal Grandfather     SOCIAL HISTORY: Social History   Socioeconomic History   Marital status: Married    Spouse name: Leighton Parody   Number of children: Not on file   Years of education: Not on file   Highest education level: Not on file  Occupational History   Not on file  Tobacco Use   Smoking status: Former    Packs/day: 0.50    Types: Cigarettes   Smokeless tobacco: Never  Vaping Use   Vaping Use: Some days  Substance and Sexual Activity   Alcohol use: Yes    Alcohol/week: 0.0 standard drinks    Comment: SOCIAL   Drug use: No   Sexual activity: Yes    Partners: Male    Birth control/protection: I.U.D.    Comment: MIRENA. 1st intercourse- 16, partners- 5  Other Topics Concern   Not on file  Social History Narrative   Lives with husband and kids   Right Handed    Drinks 4-5 cups caffeine daily   Social Determinants of Health   Financial Resource Strain: Not on file  Food Insecurity: Not on file  Transportation Needs: Not on file  Physical Activity: Not on file  Stress: Not on file  Social Connections: Not on file  Intimate Partner Violence: Not on file      Levert Feinstein, M.D. Ph.D.  [facility] [address] [place], [place] [address] Ph: [phone] Fax: [phone]  CC:  Pearline Cables, MD [address] [place],  [place] [address]  Copland, Gwenlyn Found, MD

## 2021-05-13 ENCOUNTER — Ambulatory Visit: Payer: 59

## 2021-05-13 DIAGNOSIS — R202 Paresthesia of skin: Secondary | ICD-10-CM

## 2021-05-14 ENCOUNTER — Telehealth: Payer: Self-pay | Admitting: Neurology

## 2021-05-14 NOTE — Telephone Encounter (Signed)
IMPRESSION:    MRI cervical spine (without) demonstrating: - At C5-6 disc bulging and uncovertebral joint hypertrophy with borderline mild spinal stenosis and severe biforaminal stenosis; no cord signal changes. - At C3-4 uncovertebral joint hypertrophy and facet hypertrophy with moderate right foraminal stenosis.   Please call patient, MRI cervical spine showed multilevel degenerative changes, no significant canal stenosis, but there is variable degree of foraminal narrowing at C3-4, C5-6 level

## 2021-05-14 NOTE — Telephone Encounter (Signed)
I spoke to the patient. She verbalized understanding of the results.

## 2021-06-02 ENCOUNTER — Other Ambulatory Visit: Payer: Self-pay

## 2021-06-02 ENCOUNTER — Ambulatory Visit (INDEPENDENT_AMBULATORY_CARE_PROVIDER_SITE_OTHER): Payer: 59 | Admitting: Obstetrics & Gynecology

## 2021-06-02 ENCOUNTER — Encounter: Payer: Self-pay | Admitting: Obstetrics & Gynecology

## 2021-06-02 ENCOUNTER — Other Ambulatory Visit (HOSPITAL_COMMUNITY)
Admission: RE | Admit: 2021-06-02 | Discharge: 2021-06-02 | Disposition: A | Discharge status: Home / Self Care | Payer: 59 | Source: Ambulatory Visit | Attending: Obstetrics & Gynecology | Admitting: Obstetrics & Gynecology

## 2021-06-02 VITALS — BP 110/70 | HR 64 | Ht 61.5 in | Wt 135.0 lb

## 2021-06-02 DIAGNOSIS — N871 Moderate cervical dysplasia: Secondary | ICD-10-CM

## 2021-06-02 DIAGNOSIS — Z720 Tobacco use: Secondary | ICD-10-CM | POA: Diagnosis not present

## 2021-06-02 DIAGNOSIS — Z30431 Encounter for routine checking of intrauterine contraceptive device: Secondary | ICD-10-CM

## 2021-06-02 DIAGNOSIS — Z01419 Encounter for gynecological examination (general) (routine) without abnormal findings: Secondary | ICD-10-CM | POA: Insufficient documentation

## 2021-06-02 NOTE — Progress Notes (Signed)
Betty Hall 10-09-1975 [id number]   History:    45 y.o. G4P2A2L2 Married.  Daughters 89 yo and 31 yo.  Stepson 10 yo.   RP:  Established patient presenting for annual gyn exam    HPI: Well on Mirena IUD x 09/2016.  No BTB.  No pelvic pain. H/O LEEP CIN2 in 1999, margins neg.  Paps normal since then.  No pain with IC.  Urine/BMs wnl.  Breasts normal.  BMI 25.09  Fasting health labs here today.  Cigarette smoking, trying to quit, decreased with electronic cigarette.    Past medical history,surgical history, family history and social history were all reviewed and documented in the EPIC chart.  Gynecologic History No LMP recorded. (Menstrual status: IUD).  Obstetric History OB History  Gravida Para Term Preterm AB Living  '3 2 1 ' 0 1 2  SAB IAB Ectopic Multiple Live Births  1 0 0 0      # Outcome Date GA Lbr Len/2nd Weight Sex Delivery Anes PTL Lv  3 Term 08/06/11 22w5d04:33 / 00:03 6 lb 2.1 oz (2.781 kg) F Vag-Spont None    2 Para           1 SAB              ROS: A ROS was performed and pertinent positives and negatives are included in the history.  GENERAL: No fevers or chills. HEENT: No change in vision, no earache, sore throat or sinus congestion. NECK: No pain or stiffness. CARDIOVASCULAR: No chest pain or pressure. No palpitations. PULMONARY: No shortness of breath, cough or wheeze. GASTROINTESTINAL: No abdominal pain, nausea, vomiting or diarrhea, melena or bright red blood per rectum. GENITOURINARY: No urinary frequency, urgency, hesitancy or dysuria. MUSCULOSKELETAL: No joint or muscle pain, no back pain, no recent trauma. DERMATOLOGIC: No rash, no itching, no lesions. ENDOCRINE: No polyuria, polydipsia, no heat or cold intolerance. No recent change in weight. HEMATOLOGICAL: No anemia or easy bruising or bleeding. NEUROLOGIC: No headache, seizures, numbness, tingling or weakness. PSYCHIATRIC: No depression, no loss of interest in normal activity or change in sleep pattern.      Exam:   BP 110/70   Pulse 64   Ht 5' 1.5" (1.562 m)   Wt 135 lb (61.2 kg)   SpO2 99%   BMI 25.09 kg/m   Body mass index is 25.09 kg/m.  General appearance : Well developed well nourished female. No acute distress HEENT: Eyes: no retinal hemorrhage or exudates,  Neck supple, trachea midline, no carotid bruits, no thyroidmegaly Lungs: Clear to auscultation, no rhonchi or wheezes, or rib retractions  Heart: Regular rate and rhythm, no murmurs or gallops Breast:Examined in sitting and supine position were symmetrical in appearance, no palpable masses or tenderness,  no skin retraction, no nipple inversion, no nipple discharge, no skin discoloration, no axillary or supraclavicular lymphadenopathy Abdomen: no palpable masses or tenderness, no rebound or guarding Extremities: no edema or skin discoloration or tenderness  Pelvic: Vulva: Normal             Vagina: No gross lesions or discharge  Cervix: No gross lesions or discharge.  Pap reflex done.  Uterus  RV, normal size, shape and consistency, non-tender and mobile  Adnexa  Without masses or tenderness  Anus: Normal   Assessment/Plan:  45y.o. female for annual exam   1. Encounter for gynecological examination with Papanicolaou smear of cervix Normal gynecologic exam.  Pap reflex done.  Breast exam normal.  Screening  mammogram November 2021 was negative.  Body mass index 25.09.  Good nutrition and fitness.  Fasting health labs here today. - CBC - Comp Met (CMET) - Lipid Profile - TSH - Vitamin D 1,25 dihydroxy - Cytology - PAP( [facility])  2. CIN II (cervical intraepithelial neoplasia II) Pap reflex done.  3. Encounter for routine checking of intrauterine contraceptive device (IUD) Well on Mirena IUD x 09/2016.  IUD in good position.  Will keep until 7 years.  4. Smoking trying to quit  Will decrease electronic cigarettes.  Princess Bruins MD, 8:27 AM 06/02/2021

## 2021-06-03 LAB — CYTOLOGY - PAP: Diagnosis: NEGATIVE

## 2021-06-05 ENCOUNTER — Encounter: Payer: Self-pay | Admitting: *Deleted

## 2021-06-05 LAB — COMPREHENSIVE METABOLIC PANEL
AG Ratio: 2.3 (calc) (ref 1.0–2.5)
ALT: 13 U/L (ref 6–29)
AST: 18 U/L (ref 10–30)
Albumin: 4.8 g/dL (ref 3.6–5.1)
Alkaline phosphatase (APISO): 37 U/L (ref 31–125)
BUN: 10 mg/dL (ref 7–25)
CO2: 29 mmol/L (ref 20–32)
Calcium: 9.5 mg/dL (ref 8.6–10.2)
Chloride: 101 mmol/L (ref 98–110)
Creat: 0.84 mg/dL (ref 0.50–0.99)
Globulin: 2.1 g/dL (calc) (ref 1.9–3.7)
Glucose, Bld: 87 mg/dL (ref 65–99)
Potassium: 4 mmol/L (ref 3.5–5.3)
Sodium: 138 mmol/L (ref 135–146)
Total Bilirubin: 1 mg/dL (ref 0.2–1.2)
Total Protein: 6.9 g/dL (ref 6.1–8.1)

## 2021-06-05 LAB — CBC
HCT: 43.8 % (ref 35.0–45.0)
Hemoglobin: 14.8 g/dL (ref 11.7–15.5)
MCH: 31.1 pg (ref 27.0–33.0)
MCHC: 33.8 g/dL (ref 32.0–36.0)
MCV: 92 fL (ref 80.0–100.0)
MPV: 10.5 fL (ref 7.5–12.5)
Platelets: 170 10*3/uL (ref 140–400)
RBC: 4.76 10*6/uL (ref 3.80–5.10)
RDW: 12.2 % (ref 11.0–15.0)
WBC: 3.8 10*3/uL (ref 3.8–10.8)

## 2021-06-05 LAB — VITAMIN D 1,25 DIHYDROXY
Vitamin D 1, 25 (OH)2 Total: 42 pg/mL (ref 18–72)
Vitamin D2 1, 25 (OH)2: <8 pg/mL
Vitamin D3 1, 25 (OH)2: 42 pg/mL

## 2021-06-05 LAB — LIPID PANEL
Cholesterol: 156 mg/dL (ref <200)
HDL: 75 mg/dL (ref >=50)
LDL Cholesterol (Calc): 66 mg/dL (calc)
Non-HDL Cholesterol (Calc): 81 mg/dL (calc) (ref <130)
Total CHOL/HDL Ratio: 2.1 (calc) (ref <5.0)
Triglycerides: 72 mg/dL (ref <150)

## 2021-06-05 LAB — TSH: TSH: 1.19 mIU/L

## 2021-06-17 ENCOUNTER — Other Ambulatory Visit: Payer: Self-pay | Admitting: Obstetrics & Gynecology

## 2021-06-17 DIAGNOSIS — Z1231 Encounter for screening mammogram for malignant neoplasm of breast: Secondary | ICD-10-CM

## 2021-07-30 ENCOUNTER — Ambulatory Visit
Admission: RE | Admit: 2021-07-30 | Discharge: 2021-07-30 | Disposition: A | Discharge status: Home / Self Care | Payer: 59 | Source: Ambulatory Visit | Attending: Obstetrics & Gynecology | Admitting: Obstetrics & Gynecology

## 2021-07-30 ENCOUNTER — Other Ambulatory Visit: Payer: Self-pay

## 2021-07-30 DIAGNOSIS — Z1231 Encounter for screening mammogram for malignant neoplasm of breast: Secondary | ICD-10-CM

## 2021-08-05 ENCOUNTER — Ambulatory Visit: Payer: 59 | Admitting: Nurse Practitioner

## 2021-08-05 ENCOUNTER — Encounter: Payer: Self-pay | Admitting: Nurse Practitioner

## 2021-08-05 ENCOUNTER — Other Ambulatory Visit: Payer: Self-pay

## 2021-08-05 ENCOUNTER — Encounter: Payer: Self-pay | Admitting: Obstetrics & Gynecology

## 2021-08-05 VITALS — BP 124/80 | HR 73 | Temp 98.2°F

## 2021-08-05 DIAGNOSIS — R350 Frequency of micturition: Secondary | ICD-10-CM | POA: Diagnosis not present

## 2021-08-05 NOTE — Progress Notes (Signed)
   Acute Office Visit  Subjective:    Patient ID: Betty Hall, female    DOB: 07/05/1976, 45 y.o.   MRN: [id number]   HPI 45 y.o. presents today for mild urinary urgency and frequency. She also thought she noticed an odor last night and urine appears more cloudy. Symptoms started yesterday evening. She does not feel they are worsening but thinks she is just noticing symptoms more and is worried to have an infection during a holiday weekend. Denies any vaginal symptoms.    Review of Systems  Constitutional: Negative.   Genitourinary:  Positive for frequency and urgency. Negative for difficulty urinating, dysuria and hematuria.      Objective:    Physical Exam Constitutional:      Appearance: Normal appearance.  Abdominal:     Tenderness: There is no right CVA tenderness or left CVA tenderness.  GU: Not indicated  BP 124/80   Pulse 73   Temp 98.2 F (36.8 C)   SpO2 98%  Wt Readings from Last 3 Encounters:  06/02/21 135 lb (61.2 kg)  04/30/21 133 lb (60.3 kg)  04/15/21 135 lb (61.2 kg)   UA: 1+ leukocytes, negative nitrates, trace blood, SG 1.015, Yellow/slightly cloudy.  Microscopic: wbc 0-5, rbc 0-2, few bacteria     Assessment & Plan:   Problem List Items Addressed This Visit   None Visit Diagnoses     Urinary frequency    -  Primary   Relevant Orders   Urinalysis,Complete w/RFL Culture      Plan: Urinalysis not suggestive of UTI. Culture pending. Recommend increasing water intake as she mostly drinks coffee. If symptoms worsen or do not improve she will return to office.      Olivia Mackie DNP, 12:32 PM 08/05/2021

## 2021-08-05 NOTE — Telephone Encounter (Signed)
Can patient see Tiffany for UTI.

## 2021-08-07 ENCOUNTER — Encounter: Payer: Self-pay | Admitting: Nurse Practitioner

## 2021-08-07 LAB — URINALYSIS, COMPLETE W/RFL CULTURE
Bilirubin Urine: NEGATIVE
Casts: NONE SEEN /LPF
Crystals: NONE SEEN /HPF
Glucose, UA: NEGATIVE
Hyaline Cast: NONE SEEN /LPF
Ketones, ur: NEGATIVE
Nitrites, Initial: NEGATIVE
Protein, ur: NEGATIVE
Specific Gravity, Urine: 1.015 (ref 1.001–1.035)
Yeast: NONE SEEN /HPF
pH: 5.5 (ref 5.0–8.0)

## 2021-08-07 LAB — URINE CULTURE
MICRO NUMBER:: 12675363
SPECIMEN QUALITY:: ADEQUATE

## 2021-08-07 LAB — CULTURE INDICATED

## 2021-08-10 ENCOUNTER — Other Ambulatory Visit: Payer: Self-pay | Admitting: Nurse Practitioner

## 2021-08-10 DIAGNOSIS — N3000 Acute cystitis without hematuria: Secondary | ICD-10-CM

## 2021-08-10 MED ORDER — SULFAMETHOXAZOLE-TRIMETHOPRIM 800-160 MG PO TABS: 1.0000 | ORAL_TABLET | Freq: Two times a day (BID) | ORAL | 0 refills | Status: AC

## 2022-03-31 ENCOUNTER — Encounter: Payer: Self-pay | Admitting: Obstetrics & Gynecology

## 2022-03-31 ENCOUNTER — Ambulatory Visit: Payer: 59 | Admitting: Obstetrics & Gynecology

## 2022-03-31 VITALS — BP 112/80 | HR 83

## 2022-03-31 DIAGNOSIS — Z975 Presence of (intrauterine) contraceptive device: Secondary | ICD-10-CM | POA: Diagnosis not present

## 2022-03-31 DIAGNOSIS — N921 Excessive and frequent menstruation with irregular cycle: Secondary | ICD-10-CM

## 2022-03-31 DIAGNOSIS — M5430 Sciatica, unspecified side: Secondary | ICD-10-CM | POA: Diagnosis not present

## 2022-03-31 NOTE — Progress Notes (Signed)
    Betty Hall 1976-03-11 [id number]        46 y.o.  [id number]   RP: BTB every 2 weeks on Mirena IUD x 2 months  HPI: Well on Mirena IUD x 09/2016.  Had very light menses monthly until 01/2022 when she started having BTB every 2 weeks, with the flow and duration of bleeding increasing this month.  No pelvic pain.  No vaginal discharge.  No fever.  Declines STI screen.  Patient being investigated and treated by Ortho for Sciatica.   OB History  Gravida Para Term Preterm AB Living  3 2 2  0 1 2  SAB IAB Ectopic Multiple Live Births  1 0 0 0      # Outcome Date GA Lbr Len/2nd Weight Sex Delivery Anes PTL Lv  3 Term 08/06/11 106w5d 04:33 / 00:03 6 lb 2.1 oz (2.781 kg) F Vag-Spont None    2 Term           1 SAB             Past medical history,surgical history, problem list, medications, allergies, family history and social history were all reviewed and documented in the EPIC chart.   Directed ROS with pertinent positives and negatives documented in the history of present illness/assessment and plan.  Exam:  Vitals:   03/31/22 1437  BP: 112/80  Pulse: 83  SpO2: 98%   General appearance:  Normal  Abdomen: Normal  Gynecologic exam: Vulva normal.  Speculum:  Cervix normal.  IUD strings visible at EO.  Brown discharge at [facility].  No active bleeding.  Bimanual exam:  Uterus AV, normal volume, mobile, NT.  No adnexal mass, NT.   Assessment/Plan:  46 y.o. 60   1. Breakthrough bleeding with IUD Well on Mirena IUD x 09/2016.  Had very light menses monthly until 01/2022 when she started having BTB every 2 weeks, with the flow and duration of bleeding increasing this month.  No pelvic pain.  No vaginal discharge.  No fever.  Declines STI screen.   Cervix normal.  IUD strings visible at EO.  Brown discharge at [facility].  No active bleeding.  Bimanual exam:  Uterus AV, normal volume, mobile, NT.  No adnexal mass, NT.   Mirena IUD x 5 1/2 years with increased bleeding x 2 months.  Will f/u with a  Pelvic [facility] to r/o IU lesion.  If normal endometrium, will switch to a new Mirena IUD at that visit. - IUD removal; Future - IUD Insertion; Future - US Transvaginal Non-OB; Future  2. Sciatic nerve pain, unspecified laterality Patient being investigated and treated by Ortho for Sciatica.  Will r/o a pelvic mass pressing on the sciatic nerve with pelvic US. - US Transvaginal Non-OB; Future  Other orders - Ascorbic Acid (VITAMIN C PO); Take by mouth. - Cyanocobalamin (B-12 PO); Take by mouth. - Multiple Vitamins-Minerals (ZINC PO); Take by mouth.   [place] MD, 3:09 PM 03/31/2022

## 2022-04-17 NOTE — Progress Notes (Unsigned)
[facility] [address] [place], [place] [address] [phone] [id number]  Date:  04/21/2022   Name:  Betty Hall   DOB:  1976-06-12   MRN:  [id number]  PCP:  Pearline Cables, MD    Chief Complaint: No chief complaint on file.   History of Present Illness:  Betty Hall is a 46 y.o. very pleasant female patient who presents with the following:  Pt seen today for follow-up Most recent visit with myself was in August of 2022 History of polycythemia status post hematology evaluation, thought due to smoking Gynecology care per [facility]   Hep C screening  Routine lab work done September of last year  Patient Active Problem List   Diagnosis Date Noted   Paresthesia 04/30/2021   IUD (intrauterine device) in place 09/24/2016   Smoking 1/2 pack a day or less 12/11/2014   CIN II (cervical intraepithelial neoplasia II) 11/27/2012   Galactorrhea 11/27/2012   Loss of weight 11/27/2012    Past Medical History:  Diagnosis Date   Hypertension    Osteoarthritis of facet joint at L5-S1 level of lumbosacral spine    herniated & buldging disc    Past Surgical History:  Procedure Laterality Date   CERVICAL BIOPSY  W/ LOOP ELECTRODE EXCISION  09/13/1997   CIN II   GALLBLADDER SURGERY  09/13/2000   INTRAUTERINE DEVICE INSERTION     mirena inserted 09-24-16   knee surgeries  2011, 2010, 2009   KNEE SURGERY Left 10/15/2015    Social History   Tobacco Use   Smoking status: Former    Packs/day: 0.50    Types: Cigarettes   Smokeless tobacco: Never  Vaping Use   Vaping Use: Some days  Substance Use Topics   Alcohol use: Not Currently    Comment: SOCIAL   Drug use: No    Family History  Problem Relation Age of Onset   Hypertension Father    Heart disease Maternal Grandmother    Diabetes Maternal Grandmother    Cancer Maternal Grandfather        COLON   Hypertension Paternal Grandfather     No Known  Allergies  Medication list has been reviewed and updated.  Current Outpatient Medications on File Prior to Visit  Medication Sig Dispense Refill   Ascorbic Acid (VITAMIN C PO) Take by mouth.     Cyanocobalamin (B-12 PO) Take by mouth.     levonorgestrel (MIRENA) 20 MCG/24HR IUD 1 each by Intrauterine route once.     Multiple Vitamins-Minerals (ZINC PO) Take by mouth.     No current facility-administered medications on file prior to visit.    Review of Systems:  As per HPI- otherwise negative.   Physical Examination: There were no vitals filed for this visit. There were no vitals filed for this visit. There is no height or weight on file to calculate BMI. Ideal Body Weight:    GEN: no acute distress. HEENT: Atraumatic, Normocephalic.  Ears and Nose: No external deformity. CV: RRR, No M/G/R. No JVD. No thrill. No extra heart sounds. PULM: CTA B, no wheezes, crackles, rhonchi. No retractions. No resp. distress. No accessory muscle use. ABD: S, NT, ND, +BS. No rebound. No HSM. EXTR: No c/c/e PSYCH: Normally interactive. Conversant.    Assessment and Plan: ***  Signed Abbe Amsterdam, MD

## 2022-04-21 ENCOUNTER — Ambulatory Visit: Payer: 59 | Admitting: Family Medicine

## 2022-04-21 VITALS — BP 122/60 | HR 77 | Temp 97.9°F | Resp 18 | Ht 62.5 in | Wt 135.2 lb

## 2022-04-21 DIAGNOSIS — Z1159 Encounter for screening for other viral diseases: Secondary | ICD-10-CM | POA: Diagnosis not present

## 2022-04-21 DIAGNOSIS — M791 Myalgia, unspecified site: Secondary | ICD-10-CM

## 2022-04-21 DIAGNOSIS — M25562 Pain in left knee: Secondary | ICD-10-CM

## 2022-04-21 DIAGNOSIS — Z1211 Encounter for screening for malignant neoplasm of colon: Secondary | ICD-10-CM

## 2022-04-21 DIAGNOSIS — G8929 Other chronic pain: Secondary | ICD-10-CM | POA: Diagnosis not present

## 2022-04-21 DIAGNOSIS — M25561 Pain in right knee: Secondary | ICD-10-CM | POA: Diagnosis not present

## 2022-04-21 DIAGNOSIS — M25551 Pain in right hip: Secondary | ICD-10-CM | POA: Diagnosis not present

## 2022-04-21 DIAGNOSIS — Z130 Encounter for screening for diseases of the blood and blood-forming organs and certain disorders involving the immune mechanism: Secondary | ICD-10-CM

## 2022-04-21 DIAGNOSIS — M25552 Pain in left hip: Secondary | ICD-10-CM

## 2022-04-21 DIAGNOSIS — R5383 Other fatigue: Secondary | ICD-10-CM | POA: Diagnosis not present

## 2022-04-21 DIAGNOSIS — Z131 Encounter for screening for diabetes mellitus: Secondary | ICD-10-CM | POA: Diagnosis not present

## 2022-04-21 DIAGNOSIS — Z13220 Encounter for screening for lipoid disorders: Secondary | ICD-10-CM

## 2022-04-21 LAB — COMPREHENSIVE METABOLIC PANEL
ALT: 17 U/L (ref 0–35)
AST: 16 U/L (ref 0–37)
Albumin: 5 g/dL (ref 3.5–5.2)
Alkaline Phosphatase: 64 U/L (ref 39–117)
BUN: 10 mg/dL (ref 6–23)
CO2: 28 mEq/L (ref 19–32)
Calcium: 10.1 mg/dL (ref 8.4–10.5)
Chloride: 97 mEq/L (ref 96–112)
Creatinine, Ser: 0.75 mg/dL (ref 0.40–1.20)
GFR: 96.52 mL/min (ref >60.00)
Glucose, Bld: 92 mg/dL (ref 70–99)
Potassium: 4.5 mEq/L (ref 3.5–5.1)
Sodium: 140 mEq/L (ref 135–145)
Total Bilirubin: 0.8 mg/dL (ref 0.2–1.2)
Total Protein: 7.5 g/dL (ref 6.0–8.3)

## 2022-04-21 LAB — C-REACTIVE PROTEIN: CRP: <1 mg/dL (ref 0.5–20.0)

## 2022-04-21 LAB — CBC
HCT: 49.1 % — ABNORMAL HIGH (ref 36.0–46.0)
Hemoglobin: 16.6 g/dL — ABNORMAL HIGH (ref 12.0–15.0)
MCHC: 33.9 g/dL (ref 30.0–36.0)
MCV: 92.9 fl (ref 78.0–100.0)
Platelets: 190 K/uL (ref 150.0–400.0)
RBC: 5.28 Mil/uL — ABNORMAL HIGH (ref 3.87–5.11)
RDW: 13.4 % (ref 11.5–15.5)
WBC: 6.3 K/uL (ref 4.0–10.5)

## 2022-04-21 LAB — LIPID PANEL
Cholesterol: 196 mg/dL (ref 0–200)
HDL: 54.7 mg/dL (ref >39.00)
LDL Cholesterol: 120 mg/dL — ABNORMAL HIGH (ref 0–99)
NonHDL: 141.63
Total CHOL/HDL Ratio: 4
Triglycerides: 110 mg/dL (ref 0.0–149.0)
VLDL: 22 mg/dL (ref 0.0–40.0)

## 2022-04-21 LAB — SEDIMENTATION RATE: Sed Rate: 12 mm/hr (ref 0–20)

## 2022-04-21 LAB — HEMOGLOBIN A1C: Hgb A1c MFr Bld: 5.5 % (ref 4.6–6.5)

## 2022-04-21 LAB — CK: Total CK: 65 U/L (ref 7–177)

## 2022-04-21 LAB — TSH: TSH: 1.47 u[IU]/mL (ref 0.35–5.50)

## 2022-04-21 MED ORDER — MELOXICAM 15 MG PO TABS: 15.0000 mg | ORAL_TABLET | Freq: Every day | ORAL | 0 refills | Status: DC

## 2022-04-21 NOTE — Patient Instructions (Signed)
Good to see you today- try the meloxicam as needed for pain I will be in touch with your labs asap

## 2022-04-22 ENCOUNTER — Encounter: Payer: Self-pay | Admitting: Family Medicine

## 2022-04-23 ENCOUNTER — Encounter: Payer: Self-pay | Admitting: Family Medicine

## 2022-04-23 DIAGNOSIS — G43909 Migraine, unspecified, not intractable, without status migrainosus: Secondary | ICD-10-CM

## 2022-04-23 DIAGNOSIS — M25561 Pain in right knee: Secondary | ICD-10-CM

## 2022-04-23 LAB — RHEUMATOID FACTOR: Rheumatoid fact SerPl-aCnc: <14 IU/mL (ref <14)

## 2022-04-23 LAB — CYCLIC CITRUL PEPTIDE ANTIBODY, IGG: Cyclic Citrullin Peptide Ab: <16 UNITS

## 2022-04-23 LAB — HEPATITIS C ANTIBODY: Hepatitis C Ab: NONREACTIVE

## 2022-04-23 LAB — ANA: Anti Nuclear Antibody (ANA): NEGATIVE

## 2022-05-03 ENCOUNTER — Ambulatory Visit: Payer: 59 | Admitting: Obstetrics & Gynecology

## 2022-05-05 ENCOUNTER — Encounter: Payer: Self-pay | Admitting: Obstetrics & Gynecology

## 2022-05-05 ENCOUNTER — Ambulatory Visit (INDEPENDENT_AMBULATORY_CARE_PROVIDER_SITE_OTHER): Payer: 59 | Admitting: Obstetrics & Gynecology

## 2022-05-05 ENCOUNTER — Ambulatory Visit: Payer: 59

## 2022-05-05 VITALS — BP 110/74

## 2022-05-05 DIAGNOSIS — Z30433 Encounter for removal and reinsertion of intrauterine contraceptive device: Secondary | ICD-10-CM

## 2022-05-05 DIAGNOSIS — N921 Excessive and frequent menstruation with irregular cycle: Secondary | ICD-10-CM

## 2022-05-05 DIAGNOSIS — N83201 Unspecified ovarian cyst, right side: Secondary | ICD-10-CM

## 2022-05-05 DIAGNOSIS — M5430 Sciatica, unspecified side: Secondary | ICD-10-CM

## 2022-05-05 DIAGNOSIS — Z975 Presence of (intrauterine) contraceptive device: Secondary | ICD-10-CM | POA: Diagnosis not present

## 2022-05-05 NOTE — Progress Notes (Addendum)
Betty Hall [birth year] [id number]        46 y.o.  [id number]   RP: BTB on Mirena IUD for Pelvic US  HPI: Well on Mirena IUD x 09/2016, until she started having BTB every 2 weeks in 01/2022.  Would like to change to a new Mirena IUD.  No pelvic pain.  No vaginal discharge.  No fever.  Declines STI screen.  Patient being investigated and treated by Ortho for Sciatica.     OB History  Gravida Para Term Preterm AB Living  3 2 2  0 1 2  SAB IAB Ectopic Multiple Live Births  1 0 0 0      # Outcome Date GA Lbr Len/2nd Weight Sex Delivery Anes PTL Lv  3 Term 08/06/11 [redacted]w[redacted]d 04:33 / 00:03 6 lb 2.1 oz (2.781 kg) F Vag-Spont None    2 Term           1 SAB             Past medical history,surgical history, problem list, medications, allergies, family history and social history were all reviewed and documented in the EPIC chart.   Directed ROS with pertinent positives and negatives documented in the history of present illness/assessment and plan.  Exam:  Vitals:   05/05/22 1137  BP: 110/74   General appearance:  Normal  Pelvic 05/07/22 today: T/V images.  Anteverted uterus with Fibroids.  The uterus is measured at 9.09 x 9.08 x 4.81 cm.  2 intramural, subserosal fibroids are measured at 1.11 cm and 1.47 cm.  The IUD is in good intrauterine location.  The endometrial line is thin and symmetrical, measured at 3.52 mm.  Bilateral normal ovaries with follicles.  Good perfusion to both ovaries.  No free fluid in the pelvis.                                                                    IUD procedure note       Patient presented to the office today for removal and placement of Mirena IUD. The patient had previously been provided with literature information on this method of contraception. The risks benefits and pros and cons were discussed and all her questions were answered. She is fully aware that this form of contraception is 99% effective and is good for 8 years.  Pelvic exam: Vulva  normal Vagina: No lesions or discharge Cervix: No lesions or discharge.  Strings visible at the EO.  IUD removed easily by pulling on strings with a fenestrated clamp.  IUD complete, intact. Uterus: RV position Adnexa: No masses or tenderness Rectal exam: Not done  The cervix was cleansed with Betadine solution. Hurricane spray on the cervix.  A single-tooth tenaculum was placed on the anterior cervical lip. Os finder hysterometry at 7 cm.  The IUD was shown to the patient and inserted in a sterile fashion.  The IUD string was trimmed. The single-tooth tenaculum was removed. Patient was instructed to return back to the office in one month for follow up.         Assessment/Plan:  46 y.o. 46   1. Breakthrough bleeding with IUD Well on Mirena IUD x 09/2016, until she started having BTB every 2 weeks in 01/2022.  Would like to change to a new Mirena IUD.  No pelvic pain.  No vaginal discharge.  No fever.  Declines STI screen.  Pelvic US showing a normal thin endometrial line at 3.52 mm. IUD in good IUD position. Small uterine fibroids. Decision to remove the Mirena IUD and insert a new one.  Procedures well tolerated with no Cx.  Post procedure precautions.  F/U in 4 weeks for IUD check. - IUD removal - IUD Insertion  2. Encounter for IUD removal and reinsertion Decision to remove the Mirena IUD and insert a new one.  Procedures well tolerated with no Cx.  Post procedure precautions.  F/U in 4 weeks for IUD check.  3. Right ovarian cyst Pelvic US today showing a Rt avascular ovarian cyst with debris measured at 2.7 cm, tender.  Benign appearance, but will repeat a Pelvic US at 3 months to reassess. - US Transvaginal Non-OB; Future   Genia Del MD, 12:01 PM 05/05/2022

## 2022-05-21 NOTE — Addendum Note (Signed)
Addended by: Abbe Amsterdam C on: 05/21/2022 06:09 PM   Modules accepted: Orders

## 2022-05-24 ENCOUNTER — Other Ambulatory Visit: Payer: 59

## 2022-05-24 ENCOUNTER — Other Ambulatory Visit (INDEPENDENT_AMBULATORY_CARE_PROVIDER_SITE_OTHER): Payer: 59

## 2022-05-24 DIAGNOSIS — G8929 Other chronic pain: Secondary | ICD-10-CM

## 2022-05-24 DIAGNOSIS — M25561 Pain in right knee: Secondary | ICD-10-CM

## 2022-05-24 DIAGNOSIS — M25551 Pain in right hip: Secondary | ICD-10-CM

## 2022-05-24 DIAGNOSIS — M25552 Pain in left hip: Secondary | ICD-10-CM | POA: Diagnosis not present

## 2022-05-24 DIAGNOSIS — M25562 Pain in left knee: Secondary | ICD-10-CM

## 2022-05-24 NOTE — Addendum Note (Signed)
Addended by: Abbe Amsterdam C on: 05/24/2022 01:29 PM   Modules accepted: Orders

## 2022-05-27 LAB — B. BURGDORFI ANTIBODIES BY WB

## 2022-05-27 LAB — LYME AB SCREEN RFLX: Lyme AB Screen: 1.49 index — ABNORMAL HIGH

## 2022-05-28 ENCOUNTER — Encounter: Payer: Self-pay | Admitting: Family Medicine

## 2022-06-01 ENCOUNTER — Encounter: Payer: Self-pay | Admitting: Obstetrics & Gynecology

## 2022-06-01 ENCOUNTER — Ambulatory Visit: Payer: 59 | Admitting: Obstetrics & Gynecology

## 2022-06-01 VITALS — BP 110/70 | HR 75

## 2022-06-01 DIAGNOSIS — Z30431 Encounter for routine checking of intrauterine contraceptive device: Secondary | ICD-10-CM | POA: Diagnosis not present

## 2022-06-01 NOTE — Progress Notes (Signed)
      Betty Hall 17-Feb-1976 [id number]        46 y.o.  [id number]   RP: Mirena IUD check post insertion on 05/05/22  HPI: Well since insertion with no pelvic pain, no vaginal bleeding and no vaginal discharge.  Had IC with no pain.  No fever.   OB History  Gravida Para Term Preterm AB Living  3 2 2  0 1 2  SAB IAB Ectopic Multiple Live Births  1 0 0 0      # Outcome Date GA Lbr Len/2nd Weight Sex Delivery Anes PTL Lv  3 Term 08/06/11 [redacted]w[redacted]d 04:33 / 00:03 6 lb 2.1 oz (2.781 kg) F Vag-Spont None    2 Term           1 SAB             Past medical history,surgical history, problem list, medications, allergies, family history and social history were all reviewed and documented in the EPIC chart.   Directed ROS with pertinent positives and negatives documented in the history of present illness/assessment and plan.  Exam:  Vitals:   06/01/22 0950  BP: 110/70  Pulse: 75  SpO2: 98%   General appearance:  Normal  Abdomen: Normal  Gynecologic exam: Vulva normal.  Speculum:  Cervix normal, no erythema.  IUD strings visible 3 cm length going around the cervix.  Normal secretions.  No blood.   Assessment/Plan:  46 y.o. [id number]   1. Encounter for routine checking of intrauterine contraceptive device (IUD)  Well since insertion with no pelvic pain, no vaginal bleeding and no vaginal discharge.  Had IC with no pain.  No fever.  IUD in good position with no sign of infection.  Reassured.  F/U at annual gyn exam.  Princess Bruins MD, 10:28 AM 06/01/2022

## 2022-06-16 ENCOUNTER — Other Ambulatory Visit: Payer: Self-pay | Admitting: Obstetrics & Gynecology

## 2022-06-16 DIAGNOSIS — Z1231 Encounter for screening mammogram for malignant neoplasm of breast: Secondary | ICD-10-CM

## 2022-06-17 ENCOUNTER — Encounter: Payer: Self-pay | Admitting: Neurology

## 2022-06-17 ENCOUNTER — Ambulatory Visit: Payer: 59 | Admitting: Neurology

## 2022-06-17 VITALS — BP 134/84 | HR 71 | Ht 62.0 in | Wt 138.0 lb

## 2022-06-17 DIAGNOSIS — R0683 Snoring: Secondary | ICD-10-CM

## 2022-06-17 DIAGNOSIS — G43709 Chronic migraine without aura, not intractable, without status migrainosus: Secondary | ICD-10-CM

## 2022-06-17 MED ORDER — RIZATRIPTAN BENZOATE 5 MG PO TBDP: ORAL_TABLET | ORAL | 12 refills | Status: DC

## 2022-06-17 MED ORDER — TOPIRAMATE 50 MG PO TABS: 50.0000 mg | ORAL_TABLET | Freq: Two times a day (BID) | ORAL | 6 refills | Status: DC

## 2022-06-17 NOTE — Progress Notes (Signed)
Chief Complaint  Patient presents with   New Patient (Initial Visit)    NX Dr. Orson Hall 2022/internal referral for migraine headaches Reports 13 migraines last month       ASSESSMENT AND PLAN  Betty Hall is a 46 y.o. female   Chronic migraine headache At risk for obstructive sleep apnea  Refer to sleep study  Topamax 50 mg titrating to 100 mg every night as preventive medication  Maxalt 5 mg as needed  Return to clinic with nurse practitioner in 3 months   DIAGNOSTIC DATA (LABS, IMAGING, TESTING) - I reviewed patient records, labs, notes, testing and imaging myself where available.   MEDICAL HISTORY:  Betty Hall, is a 46 year old female seen in request by her primary care physician Dr. Lamar Hall for evaluation of migraine headache, initial evaluation was on June 17, 2022  I reviewed and summarized the referring note. PMHX. Mirena replacement in Sept, 2023 Chronic migraine.  She noticed gradual increased migraine headaches since April, by September 2023, she has headache 2-3 times each week, always on the right side, severe pressure pain, lasting 45 minutes to 1 hour, she wants dark quiet room resting for a while during headache  Some of the headache woke her up from sleep, severe pounding on the right side, fell right facial swelling, but it would go away within 1 hour  Personally reviewed MRI of the brain with without contrast April 16, 2021 that was normal MRI of cervical spine in August 2022 showed mild degenerative changes, most noticeable at C5-6 with moderate canal severe bilateral foraminal stenosis, no evidence of cord compression, C3-4, moderate right foraminal stenosis  I saw patient in August 2022, at that time she complains of right neck pain, right hand paresthesia following heavy lifting, now has much improved   She did have frequent migraine at high school, describes holoacranial headache at that time, with significant light noise  sensitivity, nauseous  Because of the headache, she wake up frequently, complains of daytime sleepiness, fatigue, does have snoring sometimes   PHYSICAL EXAM:   Vitals:   06/17/22 0943  BP: 134/84  Pulse: 71  Weight: 138 lb (62.6 kg)  Height: 5\' 2"  (1.575 m)   Not recorded     Body mass index is 25.24 kg/m.  PHYSICAL EXAMNIATION:  Gen: NAD, conversant, well nourised, well groomed                     Cardiovascular: Regular rate rhythm, no peripheral edema, warm, nontender. Eyes: Conjunctivae clear without exudates or hemorrhage Neck: Supple, no carotid bruits. Pulmonary: Clear to auscultation bilaterally   NEUROLOGICAL EXAM:  MENTAL STATUS: Speech/cognition: Awake, alert, oriented to history taking and casual conversation CRANIAL NERVES: CN II: Visual fields are full to confrontation. Pupils are round equal and briskly reactive to light.  Fundoscopy examination is normal CN III, IV, VI: extraocular movement are normal. No ptosis. CN V: Facial sensation is intact to light touch CN VII: Face is symmetric with normal eye closure  CN VIII: Hearing is normal to causal conversation. CN IX, X: Phonation is normal. CN XI: Head turning and shoulder shrug are intact  MOTOR: There is no pronator drift of out-stretched arms. Muscle bulk and tone are normal. Muscle strength is normal.  REFLEXES: Reflexes are 2+ and symmetric at the biceps, triceps, knees, and ankles. Plantar responses are flexor.  SENSORY: Intact to light touch, pinprick and vibratory sensation are intact in fingers and toes.  COORDINATION: There  is no trunk or limb dysmetria noted.  GAIT/STANCE: Posture is normal. Gait is steady with normal steps, base, arm swing, and turning. Heel and toe walking are normal. Tandem gait is normal.  Romberg is absent.  REVIEW OF SYSTEMS:  Full 14 system review of systems performed and notable only for as above All other review of systems were  negative.   ALLERGIES: Allergies  Allergen Reactions   Prednisone Hypertension    HOME MEDICATIONS: Current Outpatient Medications  Medication Sig Dispense Refill   levonorgestrel (MIRENA) 20 MCG/DAY IUD 1 each by Intrauterine route once.     No current facility-administered medications for this visit.    PAST MEDICAL HISTORY: Past Medical History:  Diagnosis Date   Hypertension    Osteoarthritis of facet joint at L5-S1 level of lumbosacral spine    herniated & buldging disc    PAST SURGICAL HISTORY: Past Surgical History:  Procedure Laterality Date   CERVICAL BIOPSY  W/ LOOP ELECTRODE EXCISION  09/13/1997   CIN II   GALLBLADDER SURGERY  09/13/2000   INTRAUTERINE DEVICE INSERTION     mirena inserted 09-24-16   knee surgeries  2011, 2010, 2009   KNEE SURGERY Left 10/15/2015    FAMILY HISTORY: Family History  Problem Relation Age of Onset   Hypertension Father    Heart disease Maternal Grandmother    Diabetes Maternal Grandmother    Cancer Maternal Grandfather        COLON   Hypertension Paternal Grandfather     SOCIAL HISTORY: Social History   Socioeconomic History   Marital status: Married    Spouse name: Betty Hall   Number of children: Not on file   Years of education: Not on file   Highest education level: Not on file  Occupational History   Not on file  Tobacco Use   Smoking status: Former    Packs/day: 0.50    Types: Cigarettes   Smokeless tobacco: Never  Vaping Use   Vaping Use: Some days  Substance and Sexual Activity   Alcohol use: Yes    Comment: SOCIAL   Drug use: No   Sexual activity: Yes    Partners: Male    Birth control/protection: I.U.D.    Comment: MIRENA. 1st intercourse- 16, partners- 5  Other Topics Concern   Not on file  Social History Narrative   Lives with husband and kids   Right Handed   Drinks 4-5 cups caffeine daily   Social Determinants of Health   Financial Resource Strain: Not on file  Food Insecurity: Not on  file  Transportation Needs: Not on file  Physical Activity: Not on file  Stress: Not on file  Social Connections: Not on file  Intimate Partner Violence: Not on file      Marcial Pacas, M.D. Ph.D.  [facility] [address] [place], [place] [address] Ph: [phone] Fax: [phone]  CC:  Darreld Mclean, [address] [place],  [place] [address]  Copland, Gay Filler, MD

## 2022-07-06 ENCOUNTER — Encounter: Payer: Self-pay | Admitting: Neurology

## 2022-07-21 ENCOUNTER — Ambulatory Visit (INDEPENDENT_AMBULATORY_CARE_PROVIDER_SITE_OTHER): Payer: 59 | Admitting: Neurology

## 2022-07-21 ENCOUNTER — Encounter: Payer: Self-pay | Admitting: Neurology

## 2022-07-21 VITALS — BP 139/78 | HR 73 | Ht 62.5 in | Wt 134.6 lb

## 2022-07-21 DIAGNOSIS — R202 Paresthesia of skin: Secondary | ICD-10-CM

## 2022-07-21 DIAGNOSIS — R519 Headache, unspecified: Secondary | ICD-10-CM

## 2022-07-21 DIAGNOSIS — G4719 Other hypersomnia: Secondary | ICD-10-CM

## 2022-07-21 DIAGNOSIS — R0683 Snoring: Secondary | ICD-10-CM | POA: Diagnosis not present

## 2022-07-21 NOTE — Progress Notes (Signed)
Subjective:    Patient ID: Betty Hall is a 46 y.o. female.  HPI    Huston Foley, MD, PhD [facility] [address] [address] [place], [place] [address]  Dear Betty Hall,   I saw your patient, Betty Hall, upon your kind request, in my Sleep clinic today for initial consultation of her sleep disorder, in particular, concern for underlying obstructive sleep apnea. The patient is unaccompanied today.  As you know, [name] is a 46 year old female with an underlying medical history of migraine headaches, hypertension, and osteoarthritis, who reports snoring difficulties maintaining sleep as well as nocturnal headaches.  I reviewed your office note from 06/17/2022.  Her Epworth sleepiness score is 324, fatigue severity score is 21 out of 63.  Snoring can be loud and disturbing to her husband.  He sometimes goes to a different bedroom at night.  She has no trouble falling asleep at often wakes up in the early morning hours and cannot go back to sleep.  This is not a new issue but she started having recurrent headaches in the spring of this year.  Bedtime is generally around 10 and rise time around 5:30 AM.  She typically does not even have the alarm wake her up as she is awake often before 5:30 AM.  She has no night to night nocturia.  She has mostly right-sided headaches, since starting the Topamax the headaches are better thankfully.  She has lost a little bit of weight as well.  She drinks quite a bit of caffeine in the form of coffee, up to 6 cups/day.  Maxalt helps quite quickly within 10 minutes of taking it when she has a severe headache.  She has no family history of sleep apnea but her mother snores.  She drinks alcohol very occasionally, none since she started the Topamax.  She quit smoking last year.  She lives with her husband and 83 year old daughter.  She had a brain MRI in August 2022 and a cervical spine MRI in September 2022.  She has not had a carotid  Doppler test but this was discussed before.  Her Past Medical History Is Significant For: Past Medical History:  Diagnosis Date   Hypertension    Migraines    Osteoarthritis of facet joint at L5-S1 level of lumbosacral spine    herniated & buldging disc    Her Past Surgical History Is Significant For: Past Surgical History:  Procedure Laterality Date   CERVICAL BIOPSY  W/ LOOP ELECTRODE EXCISION  09/13/1997   CIN II   GALLBLADDER SURGERY  09/13/2000   INTRAUTERINE DEVICE INSERTION     mirena inserted 09-24-16   knee surgeries  2011, 2010, 2009   KNEE SURGERY Left 10/15/2015    Her Family History Is Significant For: Family History  Problem Relation Age of Onset   Hypertension Father    Heart disease Maternal Grandmother    Diabetes Maternal Grandmother    Cancer Maternal Grandfather        COLON   Hypertension Paternal Grandfather    Migraines Neg Hx     Her Social History Is Significant For: Social History   Socioeconomic History   Marital status: Married    Spouse name: Leighton Parody   Number of children: Not on file   Years of education: Not on file   Highest education level: Not on file  Occupational History   Not on file  Tobacco Use   Smoking status: Former    Packs/day:  0.50    Types: Cigarettes   Smokeless tobacco: Never  Vaping Use   Vaping Use: Some days  Substance and Sexual Activity   Alcohol use: Yes    Comment: SOCIAL   Drug use: No   Sexual activity: Yes    Partners: Male    Birth control/protection: I.U.D.    Comment: MIRENA. 1st intercourse- 16, partners- 5  Other Topics Concern   Not on file  Social History Narrative   Lives with husband and kids   Right Handed   Drinks 4-5 cups caffeine daily. Nothing after 0830.   Working:  Arboriculturist. In IT.   Social Determinants of Health   Financial Resource Strain: Not on file  Food Insecurity: Not on file  Transportation Needs: Not on file  Physical Activity: Not on file   Stress: Not on file  Social Connections: Not on file    Her Allergies Are:  Allergies  Allergen Reactions   Prednisone Hypertension  :   Her Current Medications Are:  Outpatient Encounter Medications as of 07/21/2022  Medication Sig   levonorgestrel (MIRENA) 20 MCG/DAY IUD 1 each by Intrauterine route once.   rizatriptan (MAXALT-MLT) 5 MG disintegrating tablet May repeat in 2 hours if needed   topiramate (TOPAMAX) 50 MG tablet Take 1 tablet (50 mg total) by mouth 2 (two) times daily.   No facility-administered encounter medications on file as of 07/21/2022.  :   Review of Systems:  Out of a complete 14 point review of systems, all are reviewed and negative with the exception of these symptoms as listed below:  Review of Systems  Neurological:        SLEEP CONSULT. See's Dr. Terrace Arabia for migraines,  does wake in middle of night with headaches.   Snoring. .  ESS  3. FSS 21.      Objective:  Neurological Exam  Physical Exam Physical Examination:   Vitals:   07/21/22 0840  BP: 139/78  Pulse: 73    General Examination: The patient is a very pleasant 46 y.o. female in no acute distress. She appears well-developed and well-nourished and well groomed.   HEENT: Normocephalic, atraumatic, pupils are equal, round and reactive to light, extraocular tracking is good without limitation to gaze excursion or nystagmus noted. Hearing is grossly intact. Face is symmetric with normal facial animation. Speech is clear with no dysarthria noted. There is no hypophonia. There is no lip, neck/head, jaw or voice tremor. Neck is supple with full range of passive and active motion. There are no carotid bruits on auscultation. Oropharynx exam reveals: mild mouth dryness, good dental hygiene and moderate airway crowding, due to small airway entry, Mallampati class IV, tonsils and tip of uvula not fully visualized.  Tongue protrudes centrally, neck circumference of 13-1/4 inches.  Chest: Clear to  auscultation without wheezing, rhonchi or crackles noted.  Heart: S1+S2+0, regular and normal without murmurs, rubs or gallops noted.   Abdomen: Soft, non-tender and non-distended.  Extremities: There is no pitting edema in the distal lower extremities bilaterally.   Skin: Warm and dry without trophic changes noted.   Musculoskeletal: exam reveals no obvious joint deformities.   Neurologically:  Mental status: The patient is awake, alert and oriented in all 4 spheres. Her immediate and remote memory, attention, language skills and fund of knowledge are appropriate. There is no evidence of aphasia, agnosia, apraxia or anomia. Speech is clear with normal prosody and enunciation. Thought process is linear. Mood is normal and affect  is normal.  Cranial nerves II - XII are as described above under HEENT exam.  Motor exam: Normal bulk, strength and tone is noted. There is no obvious action or resting tremor.  Fine motor skills and coordination: grossly intact.  Cerebellar testing: No dysmetria or intention tremor. There is no truncal or gait ataxia.  Sensory exam: intact to light touch in the upper and lower extremities.  Gait, station and balance: She stands easily. No veering to one side is noted. No leaning to one side is noted. Posture is age-appropriate and stance is narrow based. Gait shows normal stride length and normal pace. No problems turning are noted.   Assessment and Plan:    In summary, MAKENDRA VIGEANT is a very pleasant 46 y.o.-year old female with an underlying medical history of migraine headaches, hypertension, and osteoarthritis, whose history and physical exam are concerning for sleep disordered breathing, in particular obstructive sleep apnea. A laboratory attended sleep study is considered gold standard for evaluation of sleep disordered breathing and is recommended at this time and clinically justified.   I had a long chat with the patient about my findings and the diagnosis  of sleep apnea, particularly OSA, its prognosis and treatment options. We talked about medical/conservative treatments, surgical interventions and non-pharmacological approaches for symptom control. I explained, in particular, the risks and ramifications of untreated moderate to severe OSA, especially with respect to developing cardiovascular disease down the road, including congestive heart failure (CHF), difficult to treat hypertension, cardiac arrhythmias (particularly A-fib), neurovascular complications including TIA, stroke and dementia. Even type 2 diabetes has, in part, been linked to untreated OSA. Symptoms of untreated OSA may include (but may not be limited to) daytime sleepiness, nocturia (i.e. frequent nighttime urination), memory problems, mood irritability and suboptimally controlled or worsening mood disorder such as depression and/or anxiety, lack of energy, lack of motivation, physical discomfort, as well as recurrent headaches, especially morning or nocturnal headaches. We talked about the importance of maintaining a healthy lifestyle and striving for healthy weight. In addition, we talked about the importance of striving for and maintaining good sleep hygiene.  She is encouraged to scale back on her caffeine intake. I recommended the following at this time: sleep study.  I outlined the differences between a laboratory attended sleep study which is considered more comprehensive and accurate over the option of a home sleep test (HST); the latter may lead to underestimation of sleep disordered breathing in some instances and does not help with diagnosing upper airway resistance syndrome and is not accurate enough to diagnose primary central sleep apnea typically. I explained the different sleep test procedures to the patient in detail and also outlined possible surgical and non-surgical treatment options of OSA, including the use of a pressure airway pressure (PAP) device (ie CPAP, AutoPAP/APAP or  BiPAP in certain circumstances), a custom-made dental device (aka oral appliance, which would require a referral to a specialist dentist or orthodontist typically, and is generally speaking not considered a good choice for patients with full dentures or edentulous state), upper airway surgical options, such as traditional UPPP (which is not considered a first-line treatment) or the Inspire device (hypoglossal nerve stimulator, which would involve a referral for consultation with an ENT surgeon, after careful selection, following inclusion criteria). I explained the PAP treatment option to the patient in detail, as this is generally considered first-line treatment.  The patient indicated that she would be willing to try PAP therapy, if the need arises. I explained the  importance of being compliant with PAP treatment, not only for insurance purposes but primarily to improve patient's symptoms symptoms, and for the patient's long term health benefit, including to reduce Her cardiovascular risks longer-term.  She is encouraged to talk to you about pursuing a carotid Doppler ultrasound.  As she still has had nocturnal headaches and sometimes feels she has paresthesias on the right side.   We will pick up our discussion about the next steps and treatment options after testing.  We will keep her posted as to the test results by phone call and/or MyChart messaging where possible.  We will plan to follow-up in sleep clinic accordingly as well.  I answered all her questions today and the patient was in agreement.   I encouraged her to call with any interim questions, concerns, problems or updates or email [place] through MyChart.  Generally speaking, sleep test authorizations may take up to 2 weeks, sometimes less, sometimes longer, the patient is encouraged to get in touch with [place] if they do not hear back from the sleep lab staff directly within the next 2 weeks.  Thank you very much for allowing me to participate in the  care of this nice patient. If I can be of any further assistance to you please do not hesitate to talk to me.  Sincerely,   Huston Foley, MD, PhD

## 2022-07-21 NOTE — Patient Instructions (Signed)

## 2022-07-27 ENCOUNTER — Other Ambulatory Visit (HOSPITAL_COMMUNITY)
Admission: RE | Admit: 2022-07-27 | Discharge: 2022-07-27 | Disposition: A | Discharge status: Home / Self Care | Payer: 59 | Source: Ambulatory Visit | Attending: Obstetrics & Gynecology | Admitting: Obstetrics & Gynecology

## 2022-07-27 ENCOUNTER — Encounter: Payer: Self-pay | Admitting: Obstetrics & Gynecology

## 2022-07-27 ENCOUNTER — Ambulatory Visit (INDEPENDENT_AMBULATORY_CARE_PROVIDER_SITE_OTHER): Payer: 59 | Admitting: Obstetrics & Gynecology

## 2022-07-27 ENCOUNTER — Ambulatory Visit (INDEPENDENT_AMBULATORY_CARE_PROVIDER_SITE_OTHER): Payer: 59

## 2022-07-27 VITALS — BP 108/70 | HR 70 | Ht 61.75 in | Wt 134.0 lb

## 2022-07-27 DIAGNOSIS — N83201 Unspecified ovarian cyst, right side: Secondary | ICD-10-CM

## 2022-07-27 DIAGNOSIS — Z9889 Other specified postprocedural states: Secondary | ICD-10-CM | POA: Diagnosis not present

## 2022-07-27 DIAGNOSIS — Z01419 Encounter for gynecological examination (general) (routine) without abnormal findings: Secondary | ICD-10-CM | POA: Insufficient documentation

## 2022-07-27 DIAGNOSIS — Z30431 Encounter for routine checking of intrauterine contraceptive device: Secondary | ICD-10-CM | POA: Diagnosis not present

## 2022-07-27 NOTE — Progress Notes (Signed)
    Betty Hall [birth year] [id number]   History:    46 y.o.   RP:  *** patient presenting for annual gyn exam   HPI: ***  Past medical history,surgical history, family history and social history were all reviewed and documented in the EPIC chart.  Gynecologic History No LMP recorded. (Menstrual status: IUD). Contraception: {method:5051} Last Pap: ***. Results were: {norm/abn:16337} Last mammogram: ***. Results were: {norm/abn:16337} Bone Density: *** Colonoscopy: ***  Obstetric History OB History  Gravida Para Term Preterm AB Living  3 2 2  0 1 2  SAB IAB Ectopic Multiple Live Births  1 0 0 0      # Outcome Date GA Lbr Len/2nd Weight Sex Delivery Anes PTL Lv  3 Term 08/06/11 [redacted]w[redacted]d 04:33 / 00:03 6 lb 2.1 oz (2.781 kg) F Vag-Spont None    2 Term           1 SAB              ROS: A ROS was performed and pertinent positives and negatives are included in the history.  GENERAL: No fevers or chills. HEENT: No change in vision, no earache, sore throat or sinus congestion. NECK: No pain or stiffness. CARDIOVASCULAR: No chest pain or pressure. No palpitations. PULMONARY: No shortness of breath, cough or wheeze. GASTROINTESTINAL: No abdominal pain, nausea, vomiting or diarrhea, melena or bright red blood per rectum. GENITOURINARY: No urinary frequency, urgency, hesitancy or dysuria. MUSCULOSKELETAL: No joint or muscle pain, no back pain, no recent trauma. DERMATOLOGIC: No rash, no itching, no lesions. ENDOCRINE: No polyuria, polydipsia, no heat or cold intolerance. No recent change in weight. HEMATOLOGICAL: No anemia or easy bruising or bleeding. NEUROLOGIC: No headache, seizures, numbness, tingling or weakness. PSYCHIATRIC: No depression, no loss of interest in normal activity or change in sleep pattern.     Exam:   BP 108/70   Pulse 70   Ht 5' 1.75" (1.568 m)   Wt 134 lb (60.8 kg)   BMI 24.71 kg/m   Body mass index is 24.71 kg/m.  General appearance : Well developed well  nourished female. No acute distress HEENT: Eyes: no retinal hemorrhage or exudates,  Neck supple, trachea midline, no carotid bruits, no thyroidmegaly Lungs: Clear to auscultation, no rhonchi or wheezes, or rib retractions  Heart: Regular rate and rhythm, no murmurs or gallops Breast:Examined in sitting and supine position were symmetrical in appearance, no palpable masses or tenderness,  no skin retraction, no nipple inversion, no nipple discharge, no skin discoloration, no axillary or supraclavicular lymphadenopathy Abdomen: no palpable masses or tenderness, no rebound or guarding Extremities: no edema or skin discoloration or tenderness  Pelvic: Vulva: Normal             Vagina: No gross lesions or discharge  Cervix: No gross lesions or discharge. Pap reflex done.  Uterus  AV, normal size, shape and consistency, non-tender and mobile  Adnexa  Without masses or tenderness  Anus: Normal   Assessment/Plan:  46 y.o. female for annual exam   1. Encounter for routine gynecological examination with Papanicolaou smear of cervix  - Cytology - PAP( [facility])  2. H/O LEEP  3. Encounter for routine checking of intrauterine contraceptive device (IUD)  4. Right ovarian cyst  54 MD, 2:37 PM 07/27/2022

## 2022-07-28 ENCOUNTER — Encounter: Payer: Self-pay | Admitting: Obstetrics & Gynecology

## 2022-07-29 LAB — CYTOLOGY - PAP
Diagnosis: NEGATIVE
Diagnosis: REACTIVE

## 2022-08-02 DIAGNOSIS — Z1231 Encounter for screening mammogram for malignant neoplasm of breast: Secondary | ICD-10-CM

## 2022-08-10 ENCOUNTER — Telehealth: Payer: Self-pay | Admitting: Neurology

## 2022-08-10 NOTE — Telephone Encounter (Signed)
[employer] pending uploaded notes on the portal  

## 2022-08-21 ENCOUNTER — Encounter: Payer: Self-pay | Admitting: Neurology

## 2022-08-26 ENCOUNTER — Ambulatory Visit
Admission: RE | Admit: 2022-08-26 | Discharge: 2022-08-26 | Disposition: A | Discharge status: Home / Self Care | Payer: 59 | Source: Ambulatory Visit | Attending: Obstetrics & Gynecology | Admitting: Obstetrics & Gynecology

## 2022-08-26 DIAGNOSIS — Z1231 Encounter for screening mammogram for malignant neoplasm of breast: Secondary | ICD-10-CM

## 2022-09-30 ENCOUNTER — Ambulatory Visit: Payer: 59 | Admitting: Adult Health

## 2022-10-06 NOTE — Telephone Encounter (Signed)
[employer] denied the NPSG.  HST- [employer] no auth req   Patient is scheduled at [facility] for 10/19/22 at 1 pm

## 2022-10-13 NOTE — Progress Notes (Unsigned)
No chief complaint on file.     ASSESSMENT AND PLAN  Betty Hall is a 47 y.o. female   Chronic migraine headache  Topamax 50 mg titrating to 100 mg every night as preventive medication  Maxalt 5 mg as needed  Return to clinic with nurse practitioner in 3 months  At risk for sleep apnea  Sleep study scheduled 10/19/2022   DIAGNOSTIC DATA (LABS, IMAGING, TESTING) - I reviewed patient records, labs, notes, testing and imaging myself where available.   MEDICAL HISTORY:  Update 10/14/2022 Betty Hall: Returns for migraine follow-up.  Use of topiramate *** Maxalt PRN ***  Evaluated by Betty Hall for possible underlying sleep apnea on 07/21/2022, recommend completing sleep study which is currently scheduled next week.     Consult visit 06/17/2022 Betty Hall: Betty Hall, is a 47 year old female seen in request by her primary care physician Betty Hall for evaluation of migraine headache, initial evaluation was on June 17, 2022  I reviewed and summarized the referring note. PMHX. Mirena replacement in Sept, 2023 Chronic migraine.  She noticed gradual increased migraine headaches since April, by September 2023, she has headache 2-3 times each week, always on the right side, severe pressure pain, lasting 45 minutes to 1 hour, she wants dark quiet room resting for a while during headache  Some of the headache woke her up from sleep, severe pounding on the right side, fell right facial swelling, but it would go away within 1 hour  Personally reviewed MRI of the brain with without contrast April 16, 2021 that was normal MRI of cervical spine in August 2022 showed mild degenerative changes, most noticeable at C5-6 with moderate canal severe bilateral foraminal stenosis, no evidence of cord compression, C3-4, moderate right foraminal stenosis  I saw patient in August 2022, at that time she complains of right neck pain, right hand paresthesia following heavy lifting, now has much  improved   She did have frequent migraine at high school, describes holoacranial headache at that time, with significant light noise sensitivity, nauseous  Because of the headache, she wake up frequently, complains of daytime sleepiness, fatigue, does have snoring sometimes   PHYSICAL EXAM:   There were no vitals filed for this visit.  Not recorded     There is no height or weight on file to calculate BMI.  PHYSICAL EXAMNIATION:  Gen: NAD, conversant, well nourised, well groomed                     Cardiovascular: Regular rate rhythm, no peripheral edema, warm, nontender. Eyes: Conjunctivae clear without exudates or hemorrhage Neck: Supple, no carotid bruits. Pulmonary: Clear to auscultation bilaterally   NEUROLOGICAL EXAM:  MENTAL STATUS: Speech/cognition: Awake, alert, oriented to history taking and casual conversation CRANIAL NERVES: CN II: Visual fields are full to confrontation. Pupils are round equal and briskly reactive to light.  Fundoscopy examination is normal CN III, IV, VI: extraocular movement are normal. No ptosis. CN V: Facial sensation is intact to light touch CN VII: Face is symmetric with normal eye closure  CN VIII: Hearing is normal to causal conversation. CN IX, X: Phonation is normal. CN XI: Head turning and shoulder shrug are intact  MOTOR: There is no pronator drift of out-stretched arms. Muscle bulk and tone are normal. Muscle strength is normal.  REFLEXES: Reflexes are 2+ and symmetric at the biceps, triceps, knees, and ankles. Plantar responses are flexor.  SENSORY: Intact to light touch, pinprick and vibratory  sensation are intact in fingers and toes.  COORDINATION: There is no trunk or limb dysmetria noted.  GAIT/STANCE: Posture is normal. Gait is steady with normal steps, base, arm swing, and turning. Heel and toe walking are normal. Tandem gait is normal.  Romberg is absent.  REVIEW OF SYSTEMS:  Full 14 system review of systems  performed and notable only for as above All other review of systems were negative.   ALLERGIES: Allergies  Allergen Reactions   Prednisone Hypertension    HOME MEDICATIONS: Current Outpatient Medications  Medication Sig Dispense Refill   levonorgestrel (MIRENA) 20 MCG/DAY IUD 1 each by Intrauterine route once.     rizatriptan (MAXALT-MLT) 5 MG disintegrating tablet May repeat in 2 hours if needed 10 tablet 12   topiramate (TOPAMAX) 50 MG tablet Take 1 tablet (50 mg total) by mouth 2 (two) times daily. 60 tablet 6   No current facility-administered medications for this visit.    PAST MEDICAL HISTORY: Past Medical History:  Diagnosis Date   Hypertension    Migraines    Osteoarthritis of facet joint at L5-S1 level of lumbosacral spine    herniated & buldging disc    PAST SURGICAL HISTORY: Past Surgical History:  Procedure Laterality Date   CERVICAL BIOPSY  W/ LOOP ELECTRODE EXCISION  09/13/1997   CIN II   GALLBLADDER SURGERY  09/13/2000   INTRAUTERINE DEVICE INSERTION     mirena inserted 09-24-16   knee surgeries  2011, 2010, 2009   KNEE SURGERY Left 10/15/2015    FAMILY HISTORY: Family History  Problem Relation Age of Onset   Hypertension Father    Heart disease Maternal Grandmother    Diabetes Maternal Grandmother    Rheum arthritis Maternal Grandmother    Cancer Maternal Grandfather        COLON   Hypertension Paternal Grandfather     SOCIAL HISTORY: Social History   Socioeconomic History   Marital status: Married    Spouse name: Betty Hall   Number of children: Not on file   Years of education: Not on file   Highest education level: Not on file  Occupational History   Not on file  Tobacco Use   Smoking status: Former    Packs/day: 0.50    Types: Cigarettes   Smokeless tobacco: Never  Vaping Use   Vaping Use: Some days  Substance and Sexual Activity   Alcohol use: Yes    Comment: SOCIAL   Drug use: No   Sexual activity: Yes    Partners: Male     Birth control/protection: I.U.D.    Comment: MIRENA. 1st intercourse- 16, partners- 5  Other Topics Concern   Not on file  Social History Narrative   Lives with husband and kids   Right Handed   Drinks 4-5 cups caffeine daily. Nothing after 0830.   Working:  Merchandiser, retail. In IT.   Social Determinants of Health   Financial Resource Strain: Not on file  Food Insecurity: Not on file  Transportation Needs: Not on file  Physical Activity: Not on file  Stress: Not on file  Social Connections: Not on file  Intimate Partner Violence: Not on file      I spent *** minutes of face-to-face and non-face-to-face time with patient.  This included previsit chart review, lab review, study review, order entry, electronic health record documentation, patient education and discussion regarding above diagnoses and treatment plan and answered all other questions to patient satisfaction  Frann Rider, AGNP-BC  [facility]  [address] [place], [place] [address]  Phone [phone] Fax [phone] Note: This document was prepared with digital dictation and possible smart phrase technology. Any transcriptional errors that result from this process are unintentional.

## 2022-10-14 ENCOUNTER — Ambulatory Visit: Payer: 59 | Admitting: Adult Health

## 2022-10-14 ENCOUNTER — Encounter: Payer: Self-pay | Admitting: Adult Health

## 2022-10-14 VITALS — BP 127/86 | HR 78 | Ht 62.0 in | Wt 134.0 lb

## 2022-10-14 DIAGNOSIS — R29818 Other symptoms and signs involving the nervous system: Secondary | ICD-10-CM | POA: Diagnosis not present

## 2022-10-14 DIAGNOSIS — G43709 Chronic migraine without aura, not intractable, without status migrainosus: Secondary | ICD-10-CM

## 2022-10-14 MED ORDER — RIZATRIPTAN BENZOATE 5 MG PO TBDP: ORAL_TABLET | ORAL | 12 refills | Status: DC

## 2022-10-14 MED ORDER — TOPIRAMATE 100 MG PO TABS: 100.0000 mg | ORAL_TABLET | Freq: Every day | ORAL | 3 refills | Status: DC

## 2022-10-14 NOTE — Patient Instructions (Addendum)
Your Plan:  Continue topamax 100mg  nightly for prevention  Continue rizatriptan as needed   You will be called to complete a carotid ultrasound     Follow up in 6 months or call earlier if needed      Thank you for coming to see [place] at [facility]. I hope we have been able to provide you high quality care today.  You may receive a patient satisfaction survey over the next few weeks. We would appreciate your feedback and comments so that we may continue to improve ourselves and the health of our patients.

## 2022-10-27 ENCOUNTER — Ambulatory Visit (HOSPITAL_COMMUNITY)
Admission: RE | Admit: 2022-10-27 | Discharge: 2022-10-27 | Disposition: A | Discharge status: Home / Self Care | Payer: 59 | Source: Ambulatory Visit | Attending: Adult Health | Admitting: Adult Health

## 2022-10-27 ENCOUNTER — Encounter: Payer: Self-pay | Admitting: Neurology

## 2022-10-27 DIAGNOSIS — R29818 Other symptoms and signs involving the nervous system: Secondary | ICD-10-CM | POA: Insufficient documentation

## 2022-12-21 ENCOUNTER — Encounter (INDEPENDENT_AMBULATORY_CARE_PROVIDER_SITE_OTHER): Payer: 59 | Admitting: Adult Health

## 2022-12-21 DIAGNOSIS — G43709 Chronic migraine without aura, not intractable, without status migrainosus: Secondary | ICD-10-CM | POA: Diagnosis not present

## 2022-12-21 MED ORDER — ONDANSETRON HCL 4 MG PO TABS: 4.0000 mg | ORAL_TABLET | Freq: Three times a day (TID) | ORAL | 3 refills | Status: AC | PRN

## 2022-12-21 MED ORDER — RIZATRIPTAN BENZOATE 10 MG PO TBDP: 10.0000 mg | ORAL_TABLET | ORAL | 6 refills | Status: DC | PRN

## 2022-12-21 NOTE — Telephone Encounter (Signed)
Please see the MyChart message reply(ies) for my assessment and plan.    This patient gave consent for this Medical Advice Message and is aware that it may result in a bill to their insurance company, as well as the possibility of receiving a bill for a co-payment or deductible. They are an established patient, but are not seeking medical advice exclusively about a problem treated during an in person or video visit in the last seven days. I did not recommend an in person or video visit within seven days of my reply.    I spent a total of 10 minutes cumulative time within 7 days through MyChart messaging.  Conda Wannamaker, MD   

## 2022-12-31 ENCOUNTER — Encounter: Payer: Self-pay | Admitting: Obstetrics & Gynecology

## 2022-12-31 DIAGNOSIS — N760 Acute vaginitis: Secondary | ICD-10-CM

## 2022-12-31 MED ORDER — FLUCONAZOLE 150 MG PO TABS: 150.0000 mg | ORAL_TABLET | Freq: Once | ORAL | 0 refills | Status: AC

## 2022-12-31 NOTE — Telephone Encounter (Signed)
Ok to send

## 2023-01-07 MED ORDER — FLUCONAZOLE 150 MG PO TABS: ORAL_TABLET | ORAL | 0 refills | Status: DC

## 2023-01-26 ENCOUNTER — Encounter: Payer: Self-pay | Admitting: Family Medicine

## 2023-01-26 NOTE — Telephone Encounter (Signed)
Last appointment was 04/23/22- okay for lab apt? She is due for f/u

## 2023-01-27 ENCOUNTER — Ambulatory Visit: Payer: 59 | Admitting: Family Medicine

## 2023-01-27 VITALS — BP 137/82 | HR 80 | Temp 98.8°F | Resp 16 | Ht 62.0 in | Wt 133.0 lb

## 2023-01-27 DIAGNOSIS — M545 Low back pain, unspecified: Secondary | ICD-10-CM

## 2023-01-27 DIAGNOSIS — R8290 Unspecified abnormal findings in urine: Secondary | ICD-10-CM | POA: Diagnosis not present

## 2023-01-27 LAB — POCT URINALYSIS DIP (MANUAL ENTRY)
Bilirubin, UA: NEGATIVE
Blood, UA: NEGATIVE
Glucose, UA: NEGATIVE mg/dL
Ketones, POC UA: NEGATIVE mg/dL
Leukocytes, UA: NEGATIVE
Nitrite, UA: NEGATIVE
Protein Ur, POC: NEGATIVE mg/dL
Spec Grav, UA: <=1.005 — AB (ref 1.010–1.025)
Urobilinogen, UA: 0.2 E.U./dL
pH, UA: 6 (ref 5.0–8.0)

## 2023-01-27 MED ORDER — CIPROFLOXACIN HCL 500 MG PO TABS: 500.0000 mg | ORAL_TABLET | Freq: Two times a day (BID) | ORAL | 0 refills | Status: AC

## 2023-01-27 NOTE — Patient Instructions (Addendum)
It was good to see you today, I will be in touch with your urine culture soon as possible.  Please let me know if you are not feeling better within the next 1 to 2 days- Sooner if worse.    Start on cipro asap

## 2023-01-27 NOTE — Progress Notes (Addendum)
[facility] [address], [address] [place], [place] [address] [phone]  Date:  01/27/2023   Name:  Betty Hall   DOB:  November 24, 1975   MRN:  [id number]  PCP:  Pearline Cables, MD    Chief Complaint: No chief complaint on file.   History of Present Illness:  Betty Hall is a 47 y.o. very pleasant female patient who presents with the following:  Patient seen today with concern of possible kidney infection History of migraine headache, tobacco use, polycythemia thought secondary to tobacco Most recent visit with myself was in August  Colon cancer screening - recently completed per Dr Lanae Boast completed 01/10/2023  She noted some mild back pain- more on the right for about a week She is not aware of any back injury She has noted cloudy urine, no odor of her urine She has not had pain with urination or urgency in particular No hematuria No measured fever but she felt a little off/slightly warm No vomiting or abd pain, eating ok but decreased appetite   She has her IUD still- recently had a new one placed so pregnancy is extremely unlikely   Patient Active Problem List   Diagnosis Date Noted   Chronic migraine w/o aura w/o status migrainosus, not intractable 06/17/2022   Snores 06/17/2022   Paresthesia 04/30/2021   IUD (intrauterine device) in place 09/24/2016   Smoking 1/2 pack a day or less 12/11/2014   CIN II (cervical intraepithelial neoplasia II) 11/27/2012   Galactorrhea 11/27/2012   Loss of weight 11/27/2012    Past Medical History:  Diagnosis Date   Hypertension    Migraines    Osteoarthritis of facet joint at L5-S1 level of lumbosacral spine    herniated & buldging disc    Past Surgical History:  Procedure Laterality Date   CERVICAL BIOPSY  W/ LOOP ELECTRODE EXCISION  09/13/1997   CIN II   GALLBLADDER SURGERY  09/13/2000   INTRAUTERINE DEVICE INSERTION     mirena inserted 09-24-16   knee  surgeries  2011, 2010, 2009   KNEE SURGERY Left 10/15/2015    Social History   Tobacco Use   Smoking status: Former    Packs/day: .5    Types: Cigarettes   Smokeless tobacco: Never  Vaping Use   Vaping Use: Some days  Substance Use Topics   Alcohol use: Yes    Comment: SOCIAL   Drug use: No    Family History  Problem Relation Age of Onset   Hypertension Father    Heart disease Maternal Grandmother    Diabetes Maternal Grandmother    Rheum arthritis Maternal Grandmother    Cancer Maternal Grandfather        COLON   Hypertension Paternal Grandfather     Allergies  Allergen Reactions   Prednisone Hypertension    Medication list has been reviewed and updated.  Current Outpatient Medications on File Prior to Visit  Medication Sig Dispense Refill   levonorgestrel (MIRENA) 20 MCG/DAY IUD 1 each by Intrauterine route once.     ondansetron (ZOFRAN) 4 MG tablet Take 1 tablet (4 mg total) by mouth every 8 (eight) hours as needed for nausea or vomiting. 20 tablet 3   rizatriptan (MAXALT-MLT) 10 MG disintegrating tablet Take 1 tablet (10 mg total) by mouth as needed. May repeat in 2 hours if needed 12 tablet 6   rizatriptan (MAXALT-MLT) 5 MG disintegrating tablet May repeat in 2  hours if needed 10 tablet 12   topiramate (TOPAMAX) 100 MG tablet Take 1 tablet (100 mg total) by mouth at bedtime. 90 tablet 3   No current facility-administered medications on file prior to visit.    Review of Systems:  As per HPI- otherwise negative.   Physical Examination: Vitals:   01/27/23 1056  BP: 137/82  Pulse: 80  Resp: 16  Temp: 98.8 F (37.1 C)  SpO2: 100%   Vitals:   01/27/23 1056  Weight: 133 lb (60.3 kg)  Height: 5\' 2"  (1.575 m)   Body mass index is 24.33 kg/m. Ideal Body Weight: Weight in (lb) to have BMI = 25: 136.4  GEN: no acute distress.  Normal weight, looks well HEENT: Atraumatic, Normocephalic.  Ears and Nose: No external deformity. CV: RRR, No M/G/R. No  JVD. No thrill. No extra heart sounds. PULM: CTA B, no wheezes, crackles, rhonchi. No retractions. No resp. distress. No accessory muscle use. ABD: S, NT, ND, +BS. No rebound. No HSM.  Belly is benign No CVA tenderness.  Patient notes the right middle to low back as the area where she has noticed discomfort EXTR: No c/c/e PSYCH: Normally interactive. Conversant.   Results for orders placed or performed in visit on 01/27/23  Urine Culture   Specimen: Urine  Result Value Ref Range   MICRO NUMBER: [id number]    SPECIMEN QUALITY: Adequate    Sample Source URINE    STATUS: FINAL    Result: No Growth   POCT urinalysis dipstick  Result Value Ref Range   Color, UA yellow yellow   Clarity, UA clear clear   Glucose, UA negative negative mg/dL   Bilirubin, UA negative negative   Ketones, POC UA negative negative mg/dL   Spec Grav, UA <=5.409 (A) 1.010 - 1.025   Blood, UA negative negative   pH, UA 6.0 5.0 - 8.0   Protein Ur, POC negative negative mg/dL   Urobilinogen, UA 0.2 0.2 or 1.0 E.U./dL   Nitrite, UA Negative Negative   Leukocytes, UA Negative Negative    Assessment and Plan: Cloudy urine - Plan: Urine Culture, POCT urinalysis dipstick, ciprofloxacin (CIPRO) 500 MG tablet  Acute left-sided low back pain without sciatica  Patient seen today with concern of cloudy urine and lower back pain UA appears benign, culture pending.  Patient notes these symptoms are similar to when she has had a kidney infection in the past, she would like to go ahead and start antibiotics which is fine.  Will have her start on Cipro due to concern of possible pyelonephritis.  I will be in touch with urine culture, have asked her to let me know if not feeling better within the next couple of days-sooner if getting worse  Signed Abbe Amsterdam, MD  Addendum 5/18, received urine culture which is negative for infection Message to patient

## 2023-01-28 LAB — URINE CULTURE
MICRO NUMBER:: 14965757
Result:: NO GROWTH
SPECIMEN QUALITY:: ADEQUATE

## 2023-01-29 ENCOUNTER — Encounter: Payer: Self-pay | Admitting: Family Medicine

## 2023-02-01 ENCOUNTER — Encounter: Payer: Self-pay | Admitting: Obstetrics & Gynecology

## 2023-04-13 NOTE — Progress Notes (Unsigned)
No chief complaint on file.     ASSESSMENT AND PLAN  Betty Hall is a 47 y.o. female   Chronic migraine headache Transient right sided numbness likely sensory aura  Continue Topamax 100 mg nightly - advised if side effects worsen to call and can discuss either trial of lower dosage or alternative medication  Continue Maxalt 5 mg as needed  MR brain 04/2021 unremarkable  Carotid ultrasound bilateral near normal vessels    At risk for sleep apnea  Sleep study scheduled 10/19/2022    Follow-up in 6 months for migraine follow-up or call earlier if needed   DIAGNOSTIC DATA (LABS, IMAGING, TESTING) - I reviewed patient records, labs, notes, testing and imaging myself where available.   MEDICAL HISTORY:  Update 04/14/2023 JM: Patient returns for follow-up visit.  Remains on topiramate 100 mg nightly for headache prophylaxis ***.  Use of rizatriptan ***.         History provided for reference purposes only Update 10/14/2022 JM: Returns for migraine follow-up unaccompanied.  Reports great improvement of migraine headaches currently on topiramate 100 mg nightly. Reports 4-5 migraines during the months of Oct-Dec but only 1 migraine headache in the month of January. Does report some side effects on topiramate including GI side effects, smell/taste inversions, forgetfulness and decreased appetite but believes benefit of medication outweighs the side effects. Used rizatriptan 1x during the month of October which aborted headache after about 5 minutes, headaches have not been severe enough since that time to repeat needed rizatriptan.  Typically last 15 to 30 minutes and resolved without intervention.  Rizatriptan does cause fatigue but typically only gets headaches at night.  She continues to experience right facial cold sensation and mild RUE and RLE numbness prior to migraine onset, will occasionally have these symptoms (more so facial symptoms) in the evening but will not evolve into a  migraine and will resolve after short duration. Onset of these symptoms 04/2021, MRI at that time unremarkable. Does report Dr. Frances Furbish recommended carotid ultrasound to be completed due to these symptoms, advised to wait til today to discuss this.   Evaluated by Dr. Frances Furbish for possible underlying sleep apnea on 07/21/2022, recommend completing sleep study which is currently scheduled next week.     Consult visit 06/17/2022 Dr. Terrace Arabia: Betty Hall, is a 47 year old female seen in request by her primary care physician Dr. Abbe Amsterdam for evaluation of migraine headache, initial evaluation was on June 17, 2022  I reviewed and summarized the referring note. PMHX. Mirena replacement in Sept, 2023 Chronic migraine.  She noticed gradual increased migraine headaches since April, by September 2023, she has headache 2-3 times each week, always on the right side, severe pressure pain, lasting 45 minutes to 1 hour, she wants dark quiet room resting for a while during headache  Some of the headache woke her up from sleep, severe pounding on the right side, fell right facial swelling, but it would go away within 1 hour  Personally reviewed MRI of the brain with without contrast April 16, 2021 that was normal MRI of cervical spine in August 2022 showed mild degenerative changes, most noticeable at C5-6 with moderate canal severe bilateral foraminal stenosis, no evidence of cord compression, C3-4, moderate right foraminal stenosis  I saw patient in August 2022, at that time she complains of right neck pain, right hand paresthesia following heavy lifting, now has much improved   She did have frequent migraine at high school, describes holoacranial headache at  that time, with significant light noise sensitivity, nauseous  Because of the headache, she wake up frequently, complains of daytime sleepiness, fatigue, does have snoring sometimes   PHYSICAL EXAM:   There were no vitals filed for this  visit.   Not recorded     There is no height or weight on file to calculate BMI.  PHYSICAL EXAMNIATION:  Gen: NAD, conversant, well nourised, well groomed                     Cardiovascular: Regular rate rhythm, no peripheral edema, warm, nontender. Eyes: Conjunctivae clear without exudates or hemorrhage Neck: Supple, no carotid bruits. Pulmonary: Clear to auscultation bilaterally   NEUROLOGICAL EXAM:  MENTAL STATUS: Speech/cognition: Awake, alert, oriented to history taking and casual conversation CRANIAL NERVES: CN II: Visual fields are full to confrontation. Pupils are round equal and briskly reactive to light.  Fundoscopy examination is normal CN III, IV, VI: extraocular movement are normal. No ptosis. CN V: Facial sensation is intact to light touch CN VII: Face is symmetric with normal eye closure  CN VIII: Hearing is normal to causal conversation. CN IX, X: Phonation is normal. CN XI: Head turning and shoulder shrug are intact  MOTOR: There is no pronator drift of out-stretched arms. Muscle bulk and tone are normal. Muscle strength is normal.  REFLEXES: Reflexes are 2+ and symmetric at the biceps, triceps, knees, and ankles. Plantar responses are flexor.  SENSORY: Intact to light touch, pinprick and vibratory sensation are intact in fingers and toes.  COORDINATION: There is no trunk or limb dysmetria noted.  GAIT/STANCE: Posture is normal. Gait is steady with normal steps, base, arm swing, and turning. Heel and toe walking are normal. Tandem gait is normal.    REVIEW OF SYSTEMS:  Full 14 system review of systems performed and notable only for as above All other review of systems were negative.   ALLERGIES: Allergies  Allergen Reactions   Prednisone Hypertension    HOME MEDICATIONS: Current Outpatient Medications  Medication Sig Dispense Refill   levonorgestrel (MIRENA) 20 MCG/DAY IUD 1 each by Intrauterine route once.     ondansetron (ZOFRAN) 4 MG  tablet Take 1 tablet (4 mg total) by mouth every 8 (eight) hours as needed for nausea or vomiting. 20 tablet 3   rizatriptan (MAXALT-MLT) 10 MG disintegrating tablet Take 1 tablet (10 mg total) by mouth as needed. May repeat in 2 hours if needed 12 tablet 6   rizatriptan (MAXALT-MLT) 5 MG disintegrating tablet May repeat in 2 hours if needed 10 tablet 12   topiramate (TOPAMAX) 100 MG tablet Take 1 tablet (100 mg total) by mouth at bedtime. 90 tablet 3   No current facility-administered medications for this visit.    PAST MEDICAL HISTORY: Past Medical History:  Diagnosis Date   Hypertension    Migraines    Osteoarthritis of facet joint at L5-S1 level of lumbosacral spine    herniated & buldging disc    PAST SURGICAL HISTORY: Past Surgical History:  Procedure Laterality Date   CERVICAL BIOPSY  W/ LOOP ELECTRODE EXCISION  09/13/1997   CIN II   GALLBLADDER SURGERY  09/13/2000   INTRAUTERINE DEVICE INSERTION     mirena inserted 09-24-16   knee surgeries  2011, 2010, 2009   KNEE SURGERY Left 10/15/2015    FAMILY HISTORY: Family History  Problem Relation Age of Onset   Hypertension Father    Heart disease Maternal Grandmother    Diabetes Maternal Grandmother  Rheum arthritis Maternal Grandmother    Cancer Maternal Grandfather        COLON   Hypertension Paternal Grandfather     SOCIAL HISTORY: Social History   Socioeconomic History   Marital status: Married    Spouse name: Leighton Parody   Number of children: Not on file   Years of education: Not on file   Highest education level: Bachelor's degree (e.g., BA, AB, BS)  Occupational History   Not on file  Tobacco Use   Smoking status: Former    Current packs/day: 0.50    Types: Cigarettes   Smokeless tobacco: Never  Vaping Use   Vaping status: Some Days  Substance and Sexual Activity   Alcohol use: Yes    Comment: SOCIAL   Drug use: No   Sexual activity: Yes    Partners: Male    Birth control/protection: I.U.D.     Comment: MIRENA. 1st intercourse- 16, partners- 5  Other Topics Concern   Not on file  Social History Narrative   Lives with husband and kids   Right Handed   Drinks 4-5 cups caffeine daily. Nothing after 0830.   Working:  Arboriculturist. In IT.   Social Determinants of Health   Financial Resource Strain: Low Risk  (01/26/2023)   Overall Financial Resource Strain (CARDIA)    Difficulty of Paying Living Expenses: Not hard at all  Food Insecurity: No Food Insecurity (01/26/2023)   Hunger Vital Sign    Worried About Running Out of Food in the Last Year: Never true    Ran Out of Food in the Last Year: Never true  Transportation Needs: No Transportation Needs (01/26/2023)   PRAPARE - Administrator, Civil Service (Medical): No    Lack of Transportation (Non-Medical): No  Physical Activity: Insufficiently Active (01/26/2023)   Exercise Vital Sign    Days of Exercise per Week: 5 days    Minutes of Exercise per Session: 10 min  Stress: No Stress Concern Present (01/26/2023)   [employer] of Occupational Health - Occupational Stress Questionnaire    Feeling of Stress : Not at all  Social Connections: Moderately Integrated (01/26/2023)   Social Connection and Isolation Panel [NHANES]    Frequency of Communication with Friends and Family: More than three times a week    Frequency of Social Gatherings with Friends and Family: Once a week    Attends Religious Services: 1 to 4 times per year    Active Member of [employer] or Organizations: No    Attends Engineer, structural: Not on file    Marital Status: Married  Catering manager Violence: Not on file      I spent 26 minutes of face-to-face and non-face-to-face time with patient.  This included previsit chart review, lab review, study review, order entry, electronic health record documentation, patient education and discussion regarding above diagnoses and treatment plan and answered all other questions to  patient satisfaction  Ihor Austin, [facility]  [facility] [address] [place], [place] [address]  Phone [phone] Fax [phone] Note: This document was prepared with digital dictation and possible smart phrase technology. Any transcriptional errors that result from this process are unintentional.

## 2023-04-14 ENCOUNTER — Ambulatory Visit: Payer: 59 | Admitting: Adult Health

## 2023-04-14 ENCOUNTER — Encounter: Payer: Self-pay | Admitting: Adult Health

## 2023-04-14 VITALS — BP 126/80 | HR 78 | Ht 62.0 in | Wt 130.0 lb

## 2023-04-14 DIAGNOSIS — G43109 Migraine with aura, not intractable, without status migrainosus: Secondary | ICD-10-CM

## 2023-04-14 MED ORDER — PROPRANOLOL HCL 20 MG PO TABS: 20.0000 mg | ORAL_TABLET | Freq: Two times a day (BID) | ORAL | 6 refills | Status: DC

## 2023-04-14 NOTE — Patient Instructions (Addendum)
Your Plan:  Recommend trying propranolol 20mg  twice daily for headache prevention - please monitor blood pressure at home. Please call with any difficulty tolerating. Please call if headaches persist for dosage increase   Continue topamax 100mg  nightly for now - will consider tapering once migraines stable  If no benefit with propranolol, will consider trying amitriptyline  Next step would be a monthly injectable medication and then possibly botox   Continue maxalt as needed for migraines       Follow up in 6 months or call earlier if needed       Thank you for coming to see [place] at [facility]. I hope we have been able to provide you high quality care today.  You may receive a patient satisfaction survey over the next few weeks. We would appreciate your feedback and comments so that we may continue to improve ourselves and the health of our patients.

## 2023-04-19 NOTE — Patient Instructions (Signed)
It was great to see you again today, I will be in touch with your labs.  Recommend flu shot and COVID booster this fall I will touch base with your neurology team about your propranolol dosage- we may be able to try just 10 mg twice daily Will let you know what they have to say!

## 2023-04-19 NOTE — Progress Notes (Unsigned)
[facility] [address], [address] [place], [place] [address] [phone]  Date:  04/21/2023   Name:  Betty Hall   DOB:  1975/12/26   MRN:  [id number]  PCP:  Pearline Cables, MD    Chief Complaint: No chief complaint on file.   History of Present Illness:  Betty Hall is a 47 y.o. very pleasant female patient who presents with the following:  Patient seen today for physical exam- History of migraine headache, tobacco use, polycythemia thought secondary to tobacco  Most recent visit with myself was May of this year for urinary concern  She has gynecology care, also is seen by neurology  Most recent neurology visit was on August 1 Chronic migraine headache Transient right sided numbness likely sensory aura             Gradual increase in frequency over the past several months             Recommend initiating propranolol 20 mg twice daily, discussed potential side effects, recommend monitoring blood pressure and heart rate at home.  Advised to call with any difficulty tolerating.  Advised to call after 1 week if doing well for likely need of dosage increase             If no benefit or unable to tolerate, consider tricyclic antidepressant Continue Topamax 100 mg nightly for now but will try to taper off once headaches improve             Continue Maxalt 5 mg as needed for mild headache and 10mg  for severe headache             MR brain 04/2021 unremarkable             Carotid ultrasound bilateral near normal vessels  Pap smear UTD Colon cancer screening- colon 4/24 Mammogram UTD Due for blood work update  Mirena IUD Propranolol 20 twice daily for headaches Topamax at bedtime for headaches Maxalt as needed  Patient Active Problem List   Diagnosis Date Noted   Chronic migraine w/o aura w/o status migrainosus, not intractable 06/17/2022   Snores 06/17/2022   Paresthesia 04/30/2021   IUD (intrauterine device) in  place 09/24/2016   Smoking 1/2 pack a day or less 12/11/2014   CIN II (cervical intraepithelial neoplasia II) 11/27/2012   Galactorrhea 11/27/2012   Loss of weight 11/27/2012    Past Medical History:  Diagnosis Date   Hypertension    Migraines    Osteoarthritis of facet joint at L5-S1 level of lumbosacral spine    herniated & buldging disc    Past Surgical History:  Procedure Laterality Date   CERVICAL BIOPSY  W/ LOOP ELECTRODE EXCISION  09/13/1997   CIN II   GALLBLADDER SURGERY  09/13/2000   INTRAUTERINE DEVICE INSERTION     mirena inserted 09-24-16   knee surgeries  2011, 2010, 2009   KNEE SURGERY Left 10/15/2015    Social History   Tobacco Use   Smoking status: Former    Current packs/day: 0.50    Types: Cigarettes   Smokeless tobacco: Never  Vaping Use   Vaping status: Some Days  Substance Use Topics   Alcohol use: Yes    Comment: SOCIAL   Drug use: No    Family History  Problem Relation Age of Onset   Hypertension Father    Heart disease Maternal Grandmother    Diabetes Maternal Grandmother  Rheum arthritis Maternal Grandmother    Cancer Maternal Grandfather        COLON   Hypertension Paternal Grandfather     Allergies  Allergen Reactions   Prednisone Hypertension    Medication list has been reviewed and updated.  Current Outpatient Medications on File Prior to Visit  Medication Sig Dispense Refill   fluconazole (DIFLUCAN) 150 MG tablet Take 150 mg by mouth every 3 (three) days.     levonorgestrel (MIRENA) 20 MCG/DAY IUD 1 each by Intrauterine route once.     ondansetron (ZOFRAN) 4 MG tablet Take 1 tablet (4 mg total) by mouth every 8 (eight) hours as needed for nausea or vomiting. 20 tablet 3   propranolol (INDERAL) 20 MG tablet Take 1 tablet (20 mg total) by mouth 2 (two) times daily. 60 tablet 6   rizatriptan (MAXALT-MLT) 10 MG disintegrating tablet Take 1 tablet (10 mg total) by mouth as needed. May repeat in 2 hours if needed 12 tablet 6    rizatriptan (MAXALT-MLT) 5 MG disintegrating tablet May repeat in 2 hours if needed 10 tablet 12   topiramate (TOPAMAX) 100 MG tablet Take 1 tablet (100 mg total) by mouth at bedtime. 90 tablet 3   No current facility-administered medications on file prior to visit.    Review of Systems:  As per HPI- otherwise negative.   Physical Examination: There were no vitals filed for this visit. There were no vitals filed for this visit. There is no height or weight on file to calculate BMI. Ideal Body Weight:    GEN: no acute distress. HEENT: Atraumatic, Normocephalic.  Ears and Nose: No external deformity. CV: RRR, No M/G/R. No JVD. No thrill. No extra heart sounds. PULM: CTA B, no wheezes, crackles, rhonchi. No retractions. No resp. distress. No accessory muscle use. ABD: S, NT, ND, +BS. No rebound. No HSM. EXTR: No c/c/e PSYCH: Normally interactive. Conversant.    Assessment and Plan: *** Physical exam today.  Encouraged healthy diet and exercise routine Will plan further follow- up pending labs.  Signed Abbe Amsterdam, MD

## 2023-04-21 ENCOUNTER — Encounter: Payer: Self-pay | Admitting: Family Medicine

## 2023-04-21 ENCOUNTER — Ambulatory Visit (INDEPENDENT_AMBULATORY_CARE_PROVIDER_SITE_OTHER): Payer: 59 | Admitting: Family Medicine

## 2023-04-21 VITALS — BP 112/72 | HR 58 | Temp 97.6°F | Resp 18 | Ht 62.0 in | Wt 130.4 lb

## 2023-04-21 DIAGNOSIS — G43909 Migraine, unspecified, not intractable, without status migrainosus: Secondary | ICD-10-CM | POA: Diagnosis not present

## 2023-04-21 DIAGNOSIS — Z0000 Encounter for general adult medical examination without abnormal findings: Secondary | ICD-10-CM

## 2023-04-21 DIAGNOSIS — Z131 Encounter for screening for diabetes mellitus: Secondary | ICD-10-CM | POA: Diagnosis not present

## 2023-04-21 DIAGNOSIS — Z1329 Encounter for screening for other suspected endocrine disorder: Secondary | ICD-10-CM | POA: Diagnosis not present

## 2023-04-21 DIAGNOSIS — Z13220 Encounter for screening for lipoid disorders: Secondary | ICD-10-CM | POA: Diagnosis not present

## 2023-04-21 DIAGNOSIS — D751 Secondary polycythemia: Secondary | ICD-10-CM

## 2023-04-21 DIAGNOSIS — Z130 Encounter for screening for diseases of the blood and blood-forming organs and certain disorders involving the immune mechanism: Secondary | ICD-10-CM | POA: Diagnosis not present

## 2023-04-21 LAB — CBC
HCT: 52.4 % — ABNORMAL HIGH (ref 36.0–46.0)
Hemoglobin: 17.4 g/dL — ABNORMAL HIGH (ref 12.0–15.0)
MCHC: 33.3 g/dL (ref 30.0–36.0)
MCV: 95.1 fl (ref 78.0–100.0)
Platelets: 173 10*3/uL (ref 150.0–400.0)
RBC: 5.51 Mil/uL — ABNORMAL HIGH (ref 3.87–5.11)
RDW: 13 % (ref 11.5–15.5)
WBC: 7.9 10*3/uL (ref 4.0–10.5)

## 2023-04-21 LAB — LIPID PANEL
Cholesterol: 162 mg/dL (ref 0–200)
HDL: 51 mg/dL (ref >39.00)
LDL Cholesterol: 92 mg/dL (ref 0–99)
NonHDL: 111.37
Total CHOL/HDL Ratio: 3
Triglycerides: 95 mg/dL (ref 0.0–149.0)
VLDL: 19 mg/dL (ref 0.0–40.0)

## 2023-04-21 LAB — COMPREHENSIVE METABOLIC PANEL
ALT: 14 U/L (ref 0–35)
AST: 14 U/L (ref 0–37)
Albumin: 5.1 g/dL (ref 3.5–5.2)
Alkaline Phosphatase: 46 U/L (ref 39–117)
BUN: 11 mg/dL (ref 6–23)
CO2: 26 mEq/L (ref 19–32)
Calcium: 10.1 mg/dL (ref 8.4–10.5)
Chloride: 105 mEq/L (ref 96–112)
Creatinine, Ser: 0.91 mg/dL (ref 0.40–1.20)
GFR: 76 mL/min (ref >60.00)
Glucose, Bld: 97 mg/dL (ref 70–99)
Potassium: 4.4 mEq/L (ref 3.5–5.1)
Sodium: 139 mEq/L (ref 135–145)
Total Bilirubin: 0.9 mg/dL (ref 0.2–1.2)
Total Protein: 7.3 g/dL (ref 6.0–8.3)

## 2023-04-21 LAB — HEMOGLOBIN A1C: Hgb A1c MFr Bld: 5.2 % (ref 4.6–6.5)

## 2023-04-21 LAB — TSH: TSH: 1.35 u[IU]/mL (ref 0.35–5.50)

## 2023-05-02 ENCOUNTER — Encounter: Payer: Self-pay | Admitting: Adult Health

## 2023-05-03 ENCOUNTER — Ambulatory Visit: Payer: 59 | Admitting: Family

## 2023-05-03 VITALS — BP 112/60 | HR 65 | Temp 97.9°F | Resp 18 | Ht 62.0 in | Wt 131.0 lb

## 2023-05-03 DIAGNOSIS — J019 Acute sinusitis, unspecified: Secondary | ICD-10-CM

## 2023-05-03 DIAGNOSIS — J209 Acute bronchitis, unspecified: Secondary | ICD-10-CM

## 2023-05-03 MED ORDER — AMOXICILLIN-POT CLAVULANATE 875-125 MG PO TABS: 1.0000 | ORAL_TABLET | Freq: Two times a day (BID) | ORAL | 0 refills | Status: AC

## 2023-05-03 MED ORDER — FLUCONAZOLE 150 MG PO TABS: ORAL_TABLET | ORAL | 0 refills | Status: DC

## 2023-05-03 NOTE — Progress Notes (Signed)
Betty Hall is a 47 y.o. female with the following history as recorded in EpicCare:  Patient Active Problem List   Diagnosis Date Noted   Chronic migraine w/o aura w/o status migrainosus, not intractable 06/17/2022   Snores 06/17/2022   Paresthesia 04/30/2021   IUD (intrauterine device) in place 09/24/2016   Smoking 1/2 pack a day or less 12/11/2014   CIN II (cervical intraepithelial neoplasia II) 11/27/2012   Galactorrhea 11/27/2012   Loss of weight 11/27/2012    Current Outpatient Medications  Medication Sig Dispense Refill   amoxicillin-clavulanate (AUGMENTIN) 875-125 MG tablet Take 1 tablet by mouth 2 (two) times daily for 10 days. 20 tablet 0   fluconazole (DIFLUCAN) 150 MG tablet Take 1 tablet as directed; repeat after 72 hours 2 tablet 0   levonorgestrel (MIRENA) 20 MCG/DAY IUD 1 each by Intrauterine route once.     ondansetron (ZOFRAN) 4 MG tablet Take 1 tablet (4 mg total) by mouth every 8 (eight) hours as needed for nausea or vomiting. 20 tablet 3   propranolol (INDERAL) 20 MG tablet Take 1 tablet (20 mg total) by mouth 2 (two) times daily. 60 tablet 6   rizatriptan (MAXALT-MLT) 10 MG disintegrating tablet Take 1 tablet (10 mg total) by mouth as needed. May repeat in 2 hours if needed 12 tablet 6   rizatriptan (MAXALT-MLT) 5 MG disintegrating tablet May repeat in 2 hours if needed 10 tablet 12   topiramate (TOPAMAX) 100 MG tablet Take 1 tablet (100 mg total) by mouth at bedtime. 90 tablet 3   No current facility-administered medications for this visit.    Allergies: Prednisone  Past Medical History:  Diagnosis Date   Hypertension    Migraines    Osteoarthritis of facet joint at L5-S1 level of lumbosacral spine    herniated & buldging disc    Past Surgical History:  Procedure Laterality Date   CERVICAL BIOPSY  W/ LOOP ELECTRODE EXCISION  09/13/1997   CIN II   GALLBLADDER SURGERY  09/13/2000   INTRAUTERINE DEVICE INSERTION     mirena inserted 09-24-16   knee  surgeries  2011, 2010, 2009   KNEE SURGERY Left 10/15/2015    Family History  Problem Relation Age of Onset   Hypertension Father    Heart disease Maternal Grandmother    Diabetes Maternal Grandmother    Rheum arthritis Maternal Grandmother    Cancer Maternal Grandfather        COLON   Hypertension Paternal Grandfather     Social History   Tobacco Use   Smoking status: Former    Current packs/day: 0.50    Types: Cigarettes   Smokeless tobacco: Never  Substance Use Topics   Alcohol use: Yes    Comment: SOCIAL    Subjective:   Concern for sinus infection x 1 week; feels like head congestion is making migraine headaches worse; + cough; taking OTC Coricidin;  Objective:  Vitals:   05/03/23 1254  BP: 112/60  Pulse: 65  Resp: 18  Temp: 97.9 F (36.6 C)  TempSrc: Temporal  SpO2: 98%  Weight: 131 lb (59.4 kg)  Height: 5\' 2"  (1.575 m)    General: Well developed, well nourished, in no acute distress  Skin : Warm and dry.  Head: Normocephalic and atraumatic  Eyes: Sclera and conjunctiva clear; pupils round and reactive to light; extraocular movements intact  Ears: External normal; canals clear; tympanic membranes normal  Oropharynx: Pink, supple. No suspicious lesions  Neck: Supple without thyromegaly, adenopathy  Lungs: Respirations unlabored; coarse sounds in upper lobes; CVS exam: normal rate and regular rhythm.  Neurologic: Alert and oriented; speech intact; face symmetrical; moves all extremities well; CNII-XII intact without focal deficit   Assessment:  1. Acute sinusitis, recurrence not specified, unspecified location   2. Acute bronchitis, unspecified organism     Plan:  Rx for Augmentin 875 mg bid x 10 days, Rx for Diflucan; offered Toradol injection but patient defers today- she will return if she changes her mind.   No follow-ups on file.  No orders of the defined types were placed in this encounter.   Requested Prescriptions   Signed Prescriptions Disp  Refills   amoxicillin-clavulanate (AUGMENTIN) 875-125 MG tablet 20 tablet 0    Sig: Take 1 tablet by mouth 2 (two) times daily for 10 days.   fluconazole (DIFLUCAN) 150 MG tablet 2 tablet 0    Sig: Take 1 tablet as directed; repeat after 72 hours

## 2023-05-17 ENCOUNTER — Encounter: Payer: Self-pay | Admitting: Hematology

## 2023-05-17 ENCOUNTER — Inpatient Hospital Stay: Payer: 59 | Attending: Hematology | Admitting: Hematology

## 2023-05-17 ENCOUNTER — Inpatient Hospital Stay: Payer: 59

## 2023-05-17 VITALS — BP 154/86 | HR 63 | Temp 97.9°F | Resp 17 | Wt 135.4 lb

## 2023-05-17 DIAGNOSIS — Z800 Family history of malignant neoplasm of digestive organs: Secondary | ICD-10-CM

## 2023-05-17 DIAGNOSIS — Z8249 Family history of ischemic heart disease and other diseases of the circulatory system: Secondary | ICD-10-CM

## 2023-05-17 DIAGNOSIS — D751 Secondary polycythemia: Secondary | ICD-10-CM | POA: Diagnosis present

## 2023-05-17 DIAGNOSIS — Z888 Allergy status to other drugs, medicaments and biological substances status: Secondary | ICD-10-CM | POA: Diagnosis not present

## 2023-05-17 DIAGNOSIS — G629 Polyneuropathy, unspecified: Secondary | ICD-10-CM

## 2023-05-17 DIAGNOSIS — Z8261 Family history of arthritis: Secondary | ICD-10-CM

## 2023-05-17 DIAGNOSIS — Z79899 Other long term (current) drug therapy: Secondary | ICD-10-CM

## 2023-05-17 DIAGNOSIS — Z833 Family history of diabetes mellitus: Secondary | ICD-10-CM | POA: Diagnosis not present

## 2023-05-17 DIAGNOSIS — Z87891 Personal history of nicotine dependence: Secondary | ICD-10-CM

## 2023-05-17 DIAGNOSIS — I10 Essential (primary) hypertension: Secondary | ICD-10-CM | POA: Diagnosis not present

## 2023-05-17 DIAGNOSIS — R200 Anesthesia of skin: Secondary | ICD-10-CM | POA: Diagnosis not present

## 2023-05-17 DIAGNOSIS — M1990 Unspecified osteoarthritis, unspecified site: Secondary | ICD-10-CM

## 2023-05-17 NOTE — Progress Notes (Signed)
[facility]   [phone] [phone]   Clinic New Consult Note   Patient Care Team: Copland, Gwenlyn Found, MD as PCP - General (Family Medicine)  Date of Service:  05/17/2023   CHIEF COMPLAINTS/PURPOSE OF CONSULTATION:  Polycythemia.  REFERRING PHYSICIAN:  Copland, Jessica C,MD  HISTORY OF PRESENTING ILLNESS:  Betty Hall 47 y.o. female is a here because of Polycythemia. The patient was referred by Abbe Amsterdam C,MD. The patient presents to the clinic today alone.  She smokes half pack to 1 pack daily for the past 6 years, she has attempted smoking cessation a few times, but unsuccessful.  She was noticed to have elevated hemoglobin and hematocrit since 5 years ago.  This has been intermittent, her H&H sometime normal, but up to 17.4/52.4%. He was seen by my partner Dr. Candise Che in 2020 for polycythemia, lab workup was negative for JAK2 mutation, polycythemia vera was ruled out.  Patient was recommended to donate blood.  She did try, but unsuccessful due to poor IV access.   Pt state she has chronic migraines,  which did not start till 6 months after she had a  back injury.  She has a peripheral neuropathy also.  She has been seen by neurologist Dr. Terrace Arabia.   She has a PMHx of.... Chronic Migraine Hypertension Arthritis  Socially... Married smoker  REVIEW OF SYSTEMS:   Constitutional:(-) Denies fevers, chills or abnormal night sweats Eyes:(-) Denies blurriness of vision, double vision or watery eyes Ears, nose, mouth, throat, and face: (-)Denies mucositis or sore throat Respiratory:(-) Denies cough, dyspnea or wheezes Cardiovascular:(-)  Denies palpitation, chest discomfort or lower extremity swelling Gastrointestinal: (-) Denies nausea, heartburn or change in bowel habits Skin: (-) Denies abnormal skin rashes Lymphatics: (-)Denies new lymphadenopathy or easy bruising Neurological:(+)numbness,(+) tingling or (-)new  weaknesses Behavioral/Psych: (-) Mood is stable, no new changes  All other systems were reviewed with the patient and are negative.   MEDICAL HISTORY:  Past Medical History:  Diagnosis Date   Hypertension    Migraines    Osteoarthritis of facet joint at L5-S1 level of lumbosacral spine    herniated & buldging disc    SURGICAL HISTORY: Past Surgical History:  Procedure Laterality Date   CERVICAL BIOPSY  W/ LOOP ELECTRODE EXCISION  09/13/1997   CIN II   GALLBLADDER SURGERY  09/13/2000   INTRAUTERINE DEVICE INSERTION     mirena inserted 09-24-16   knee surgeries  2011, 2010, 2009   KNEE SURGERY Left 10/15/2015    SOCIAL HISTORY: Social History   Socioeconomic History   Marital status: Married    Spouse name: Leighton Parody   Number of children: Not on file   Years of education: Not on file   Highest education level: Bachelor's degree (e.g., BA, AB, BS)  Occupational History   Not on file  Tobacco Use   Smoking status: Former    Current packs/day: 0.50    Average packs/day: 0.5 packs/day for 15.0 years (7.5 ttl pk-yrs)    Types: Cigarettes    Start date: 05/16/2008   Smokeless tobacco: Never  Vaping Use   Vaping status: Some Days  Substance and Sexual Activity   Alcohol use: Yes    Comment: SOCIAL   Drug use: No   Sexual activity: Yes    Partners: Male    Birth control/protection: I.U.D.    Comment: MIRENA. 1st intercourse- 16, partners- 5  Other Topics Concern   Not on file  Social History Narrative  Lives with husband and kids   Right Handed   Drinks 4-5 cups caffeine daily. Nothing after 0830.   Working:  Arboriculturist. In IT.   Social Determinants of Health   Financial Resource Strain: Low Risk  (01/26/2023)   Overall Financial Resource Strain (CARDIA)    Difficulty of Paying Living Expenses: Not hard at all  Food Insecurity: No Food Insecurity (01/26/2023)   Hunger Vital Sign    Worried About Running Out of Food in the Last Year: Never true     Ran Out of Food in the Last Year: Never true  Transportation Needs: No Transportation Needs (01/26/2023)   PRAPARE - Administrator, Civil Service (Medical): No    Lack of Transportation (Non-Medical): No  Physical Activity: Insufficiently Active (01/26/2023)   Exercise Vital Sign    Days of Exercise per Week: 5 days    Minutes of Exercise per Session: 10 min  Stress: No Stress Concern Present (01/26/2023)   [employer] of Occupational Health - Occupational Stress Questionnaire    Feeling of Stress : Not at all  Social Connections: Moderately Integrated (01/26/2023)   Social Connection and Isolation Panel [NHANES]    Frequency of Communication with Friends and Family: More than three times a week    Frequency of Social Gatherings with Friends and Family: Once a week    Attends Religious Services: 1 to 4 times per year    Active Member of [employer] or Organizations: No    Attends Engineer, structural: Not on file    Marital Status: Married  Catering manager Violence: Not on file    FAMILY HISTORY: Family History  Problem Relation Age of Onset   Hypertension Father    Heart disease Maternal Grandmother    Diabetes Maternal Grandmother    Rheum arthritis Maternal Grandmother    Cancer Maternal Grandfather        COLON   Hypertension Paternal Grandfather     ALLERGIES:  is allergic to prednisone.  MEDICATIONS:  Current Outpatient Medications  Medication Sig Dispense Refill   fluconazole (DIFLUCAN) 150 MG tablet Take 1 tablet as directed; repeat after 72 hours 2 tablet 0   levonorgestrel (MIRENA) 20 MCG/DAY IUD 1 each by Intrauterine route once.     ondansetron (ZOFRAN) 4 MG tablet Take 1 tablet (4 mg total) by mouth every 8 (eight) hours as needed for nausea or vomiting. 20 tablet 3   propranolol (INDERAL) 20 MG tablet Take 1 tablet (20 mg total) by mouth 2 (two) times daily. 60 tablet 6   rizatriptan (MAXALT-MLT) 10 MG disintegrating tablet Take 1 tablet  (10 mg total) by mouth as needed. May repeat in 2 hours if needed 12 tablet 6   rizatriptan (MAXALT-MLT) 5 MG disintegrating tablet May repeat in 2 hours if needed 10 tablet 12   topiramate (TOPAMAX) 100 MG tablet Take 1 tablet (100 mg total) by mouth at bedtime. 90 tablet 3   No current facility-administered medications for this visit.    PHYSICAL EXAMINATION: ECOG PERFORMANCE STATUS: 0 - Asymptomatic  Vitals:   05/17/23 1537  BP: (!) 154/86  Pulse: 63  Resp: 17  Temp: 97.9 F (36.6 C)  SpO2: 100%   Filed Weights   05/17/23 1537  Weight: 135 lb 6.4 oz (61.4 kg)     GENERAL:alert, no distress and comfortable SKIN: skin color normal, no rashes or significant lesions EYES: normal, Conjunctiva are pink and non-injected, sclera clear  NEURO: alert &  oriented x 3 with fluent speech  LABORATORY DATA:  I have reviewed the data as listed    Latest Ref Rng & Units 04/21/2023   11:09 AM 04/21/2022   11:49 AM 06/02/2021    8:45 AM  CBC  WBC 4.0 - 10.5 K/uL 7.9  6.3  3.8   Hemoglobin 12.0 - 15.0 g/dL 16.1  09.6  04.5   Hematocrit 36.0 - 46.0 % 52.4  49.1  43.8   Platelets 150.0 - 400.0 K/uL 173.0  190.0  170        Latest Ref Rng & Units 04/21/2023   11:09 AM 04/21/2022   11:49 AM 06/02/2021    8:45 AM  CMP  Glucose 70 - 99 mg/dL 97  92  87   BUN 6 - 23 mg/dL 11  10  10    Creatinine 0.40 - 1.20 mg/dL 4.09  8.11  9.14   Sodium 135 - 145 mEq/L 139  140  138   Potassium 3.5 - 5.1 mEq/L 4.4  4.5  4.0   Chloride 96 - 112 mEq/L 105  97  101   CO2 19 - 32 mEq/L 26  28  29    Calcium 8.4 - 10.5 mg/dL 78.2  95.6  9.5   Total Protein 6.0 - 8.3 g/dL 7.3  7.5  6.9   Total Bilirubin 0.2 - 1.2 mg/dL 0.9  0.8  1.0   Alkaline Phos 39 - 117 U/L 46  64    AST 0 - 37 U/L 14  16  18    ALT 0 - 35 U/L 14  17  13       RADIOGRAPHIC STUDIES: I have personally reviewed the radiological images as listed and agreed with the findings in the report. No results found.  ASSESSMENT & PLAN:  Betty Hall is a 47 y.o.  female with a history of    1.erythrocytosis secondary to smoking -She has had intermittent erythrocytosis since 2020, previous workup was negative for JAK2 mutation and MPN panel, polycythemia vera has been ruled out. -Her erythrocytosis is likely secondary to smoking, she also noticed her hemoglobin is normal when she does not smoke. -I again encouraged her to quit smoking completely. -She has attempted blood donation, but unsuccessful due to poor IV access. -I recommend her to consider removing IUD, to allow have normal menstrual period, which would likely decrease her erythrocytosis. -I also discussed the role of baby aspirin, to prevent a thrombosis, especially stroke and heart attack.  I reviewed with her that the risk of thrombosis is much less and secondary polycythemia, and that she does not need therapeutic phlebotomy. -I will see her as needed.   PLAN:  - I recommend pt to stop smoking and remove the IUD contraceptive. - I recommend PCP to continue to monitor blood counts, I do not think she needs therapeutic phlebotomy. -I will see her back as needed  No orders of the defined types were placed in this encounter.   All questions were answered. The patient knows to call the clinic with any problems, questions or concerns. The total time spent in the appointment was 30 minutes.     Malachy Mood, MD 05/17/2023 8:56 PM  I, Monica Martinez, am acting as scribe for Malachy Mood, MD.   I have reviewed the above documentation for accuracy and completeness, and I agree with the above.

## 2023-05-19 MED ORDER — AMITRIPTYLINE HCL 10 MG PO TABS: 10.0000 mg | ORAL_TABLET | Freq: Every day | ORAL | 3 refills | Status: DC

## 2023-05-23 ENCOUNTER — Other Ambulatory Visit: Payer: Self-pay | Admitting: Anesthesiology

## 2023-05-23 MED ORDER — PROPRANOLOL HCL 10 MG PO TABS: 10.0000 mg | ORAL_TABLET | Freq: Two times a day (BID) | ORAL | 3 refills | Status: DC

## 2023-05-23 MED ORDER — PROPRANOLOL HCL 10 MG PO TABS: 10.0000 mg | ORAL_TABLET | Freq: Every day | ORAL | 3 refills | Status: DC

## 2023-05-23 NOTE — Telephone Encounter (Signed)
Ok to send prescription for Propranolol 10 mg BID, per Ihor Austin.

## 2023-06-01 IMAGING — MR MR HEAD WO/W CM
14 of 16 series · 42 of 48 positions shown · IV contrast (gadavist)
Comparison: None.

CLINICAL DATA: Right hand and leg numbness

EXAM:
MRI HEAD WITHOUT AND WITH CONTRAST
TECHNIQUE: Multiplanar, multiecho pulse sequences of the brain and surrounding
structures were obtained without and with intravenous contrast.
CONTRAST:  6.5mL GADAVIST GADOBUTROL 1 MMOL/ML IV SOLN

[Series 5: DWI · axial · 3.0mm · 0.88mm/px · z∈[-77,+73]mm · 8 of 102 slices shown (1 of 4)]
[im 1/102]
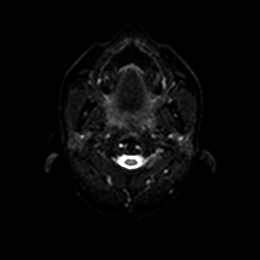
[im 15/102]
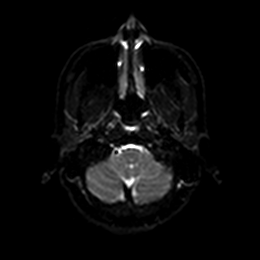
[im 29/102]
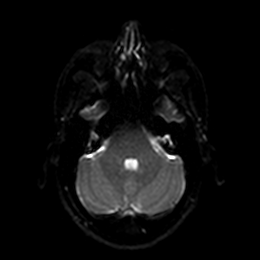
[im 44/102]
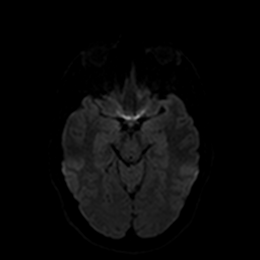
[im 58/102]
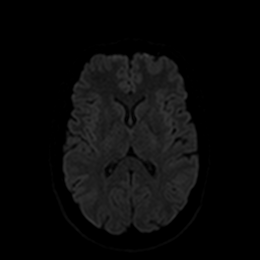
[im 73/102]
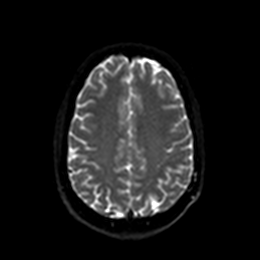
[im 87/102]
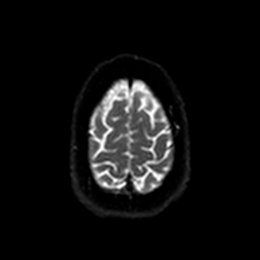
[im 102/102]
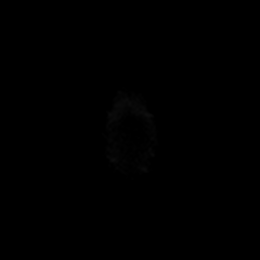

[Series 6: DWI · axial · 3.0mm · 0.88mm/px · z∈[-77,+73]mm · 3 of 50 slices shown (2 of 4)]
[im 1/50]
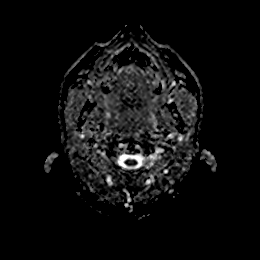
[im 25/50]
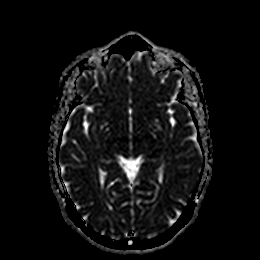
[im 50/50]
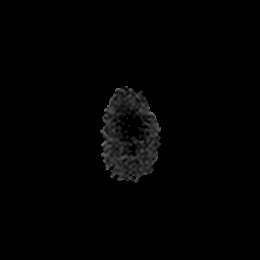

[Series 7: DWI · coronal · 4.0mm · 0.88mm/px · 5 of 68 slices shown (3 of 4)]
[im 1/68]
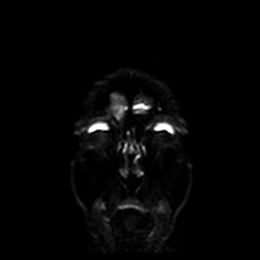
[im 17/68]
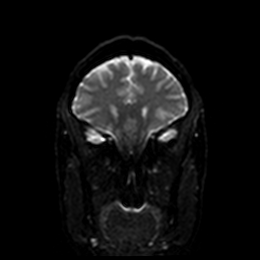
[im 34/68]
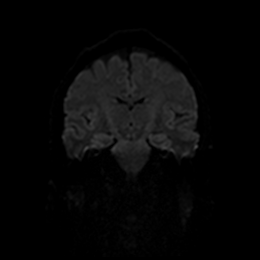
[im 51/68]
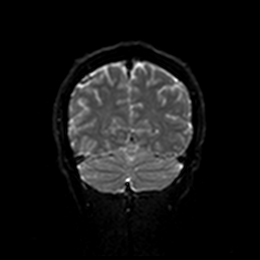
[im 68/68]
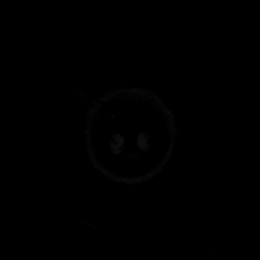

[Series 8: DWI · coronal · 4.0mm · 0.88mm/px · 2 of 34 slices shown (4 of 4)]
[im 1/34]
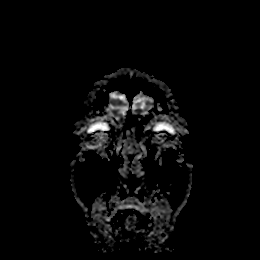
[im 34/34]
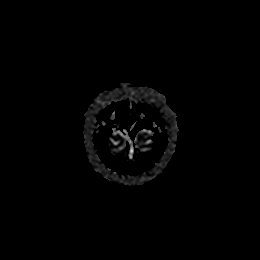

[Series 9: T1 · sagittal · 5.0mm · 0.75mm/px · 2 of 23 slices shown]
[im 1/23]
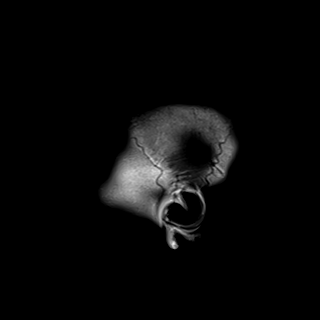
[im 23/23]
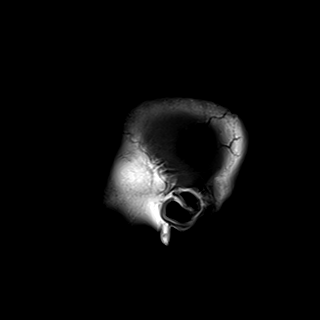

[Series 10: T2 · axial · 5.0mm · 0.72mm/px · z∈[-83,+79]mm · 2 of 28 slices shown]
[im 1/28]
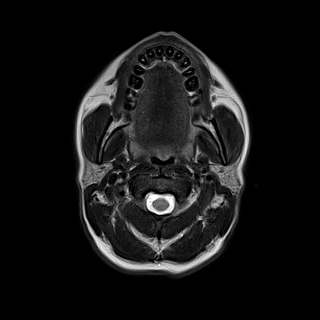
[im 28/28]
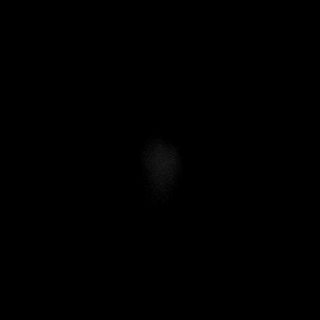

[Series 11: FLAIR · axial · 5.0mm · 0.45mm/px · z∈[-84,+78]mm · 2 of 28 slices shown]
[im 1/28]
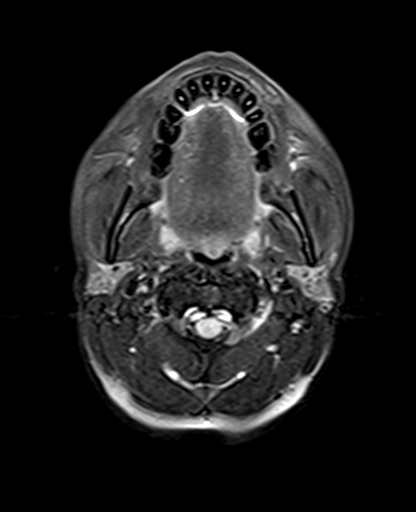
[im 28/28]
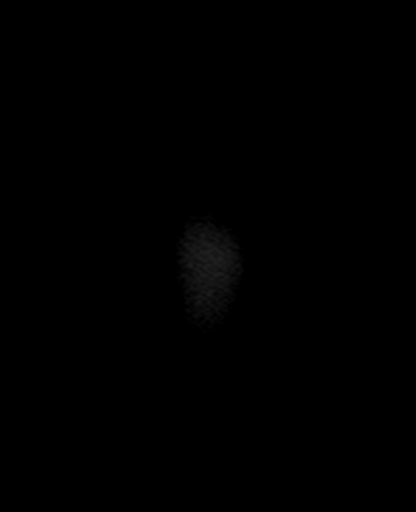

[Series 12: mag_images · axial · 3.0mm · 0.90mm/px · z∈[-79,+74]mm · 3 of 52 slices shown]
[im 1/52]
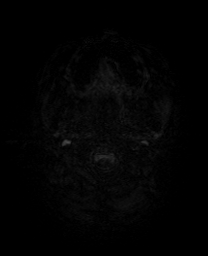
[im 26/52]
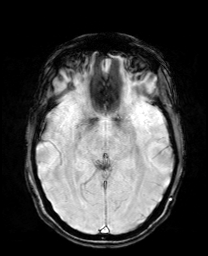
[im 52/52]
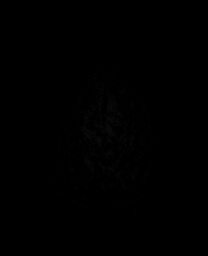

[Series 13: pha_images · axial · 3.0mm · 0.90mm/px · z∈[-79,+74]mm · 3 of 52 slices shown]
[im 1/52]
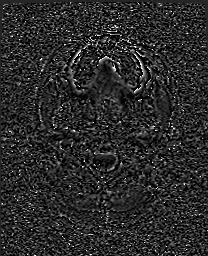
[im 26/52]
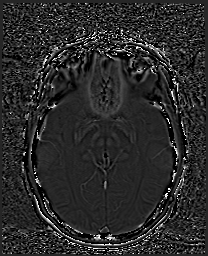
[im 52/52]
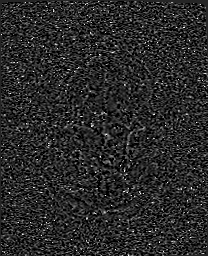

[Series 14: swi_images · axial · 3.0mm · 0.90mm/px · z∈[-79,+74]mm · 3 of 52 slices shown]
[im 1/52]
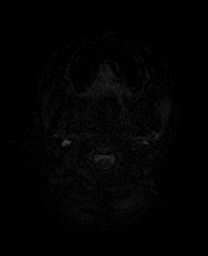
[im 26/52]
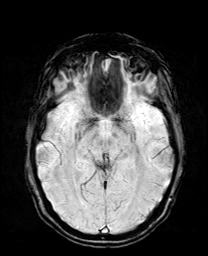
[im 52/52]
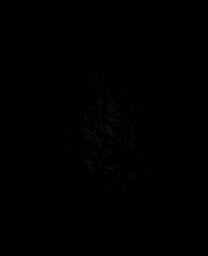

[Series 15: mip_images(sw) · axial · 24.0mm · 0.90mm/px · z∈[-69,+63]mm · 3 of 45 slices shown]
[im 1/45]
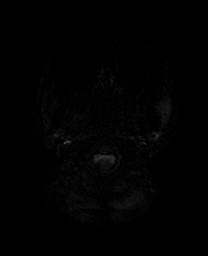
[im 23/45]
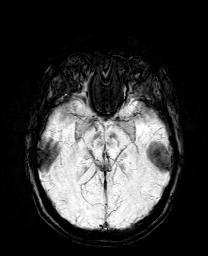
[im 45/45]
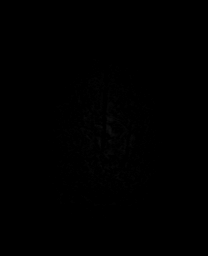

[Series 17: T2 post-contrast · coronal · 5.0mm · 0.72mm/px · 2 of 28 slices shown]
[im 1/28]
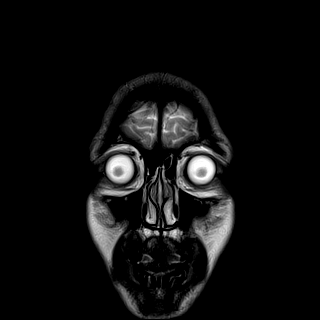
[im 28/28]
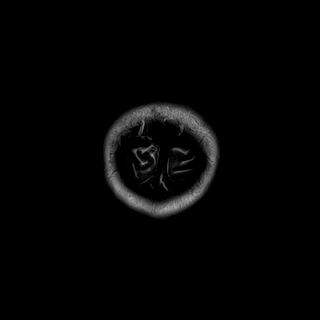

[Series 19: T1 post-contrast · coronal · 5.0mm · 0.34mm/px · 2 of 28 slices shown (1 of 2)]
[im 1/28]
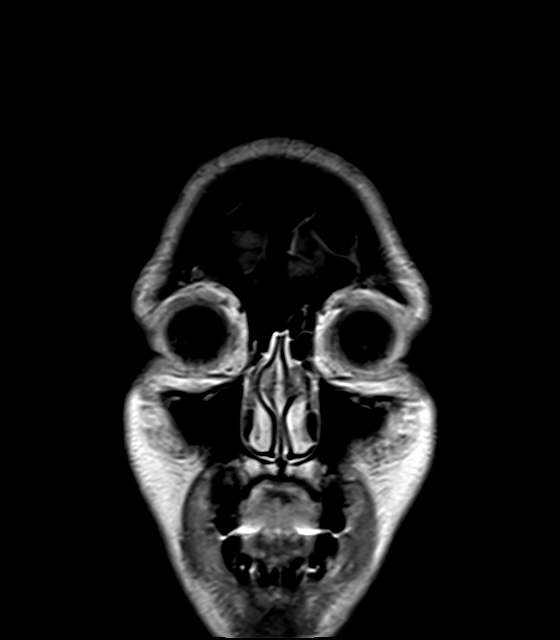
[im 28/28]
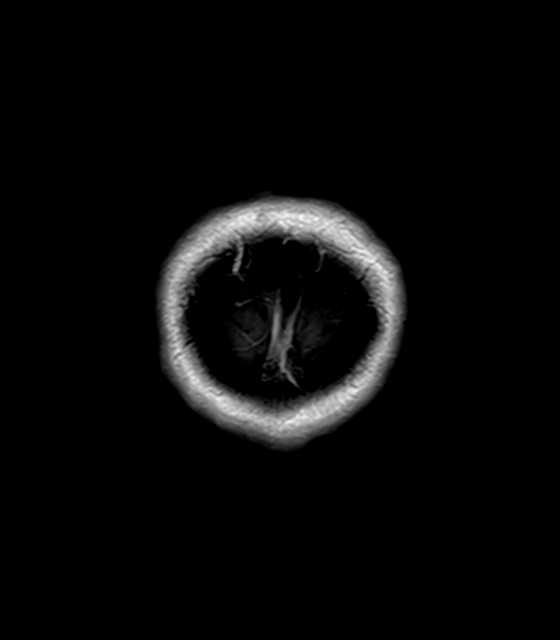

[Series 20: T1 post-contrast · sagittal · 5.0mm · 0.72mm/px · 2 of 23 slices shown (2 of 2)]
[im 1/23]
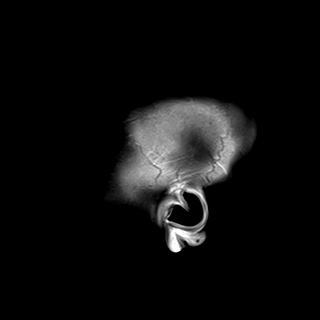
[im 23/23]
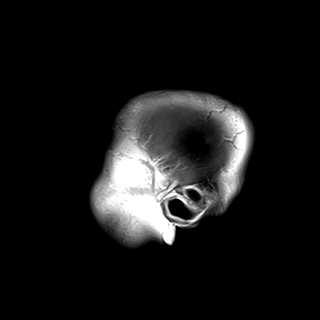

[42 of 48 positions shown; findings below may reference images not displayed]

FINDINGS: Brain: No acute infarct, mass effect or extra-axial collection. No
acute or chronic hemorrhage. Normal white matter signal, parenchymal
volume and CSF spaces. The midline structures are normal. There is
no abnormal contrast enhancement.

Vascular: Major flow voids are preserved.

Skull and upper cervical spine: Normal calvarium and skull base.
Visualized upper cervical spine and soft tissues are normal.

Sinuses/Orbits:No paranasal sinus fluid levels or advanced mucosal
thickening. No mastoid or middle ear effusion. Normal orbits.
IMPRESSION: Normal brain MRI.

## 2023-06-11 ENCOUNTER — Other Ambulatory Visit: Payer: Self-pay | Admitting: Adult Health

## 2023-06-20 NOTE — Telephone Encounter (Signed)
Pt called and was scheduled a Possible Medication Change appt with provider as mentioned in Mychart message.

## 2023-06-21 NOTE — Progress Notes (Unsigned)
Virtual Visit via Video Note  I connected with Betty Hall on 06/21/23 at  7:45 AM EDT by a video enabled telemedicine application and verified that I am speaking with the correct person using two identifiers.  Location: Patient: in home Provider: in office   I discussed the limitations of evaluation and management by telemedicine and the availability of in person appointments. The patient expressed understanding and agreed to proceed.     ASSESSMENT AND PLAN  Betty Hall is a 47 y.o. female   Chronic migraine headache Transient right sided numbness likely sensory aura  Currently having 8-10 migraine days per month on propranolol, amitriptyline and topiramate, this past week daily migraine. Unable to increase dosages due to side effects Recommend initiating Qulipta 60 mg daily for migraine prevention  Advised to decrease topiramate to 50mg  nightly 1 week after starting Qulipta. Can discontinue if doing well after 1-2 weeks. Can further consider stopping amitriptyline and propranolol if migraines improve  Next step: Emgality, Ajovy, botox  Continue Maxalt 5 mg as needed for mild headache and 10mg  for severe headache  MR brain 04/2021 unremarkable  Carotid ultrasound bilateral near normal vessels      Follow-up as scheduled in February or call earlier if needed    DIAGNOSTIC DATA (LABS, IMAGING, TESTING) - I reviewed patient records, labs, notes, testing and imaging myself where available.   MEDICAL HISTORY:  Update 06/22/2023 JM: Patient returns for sooner scheduled visit to discuss medication changes via MyChart video visit.  Was placed on propranolol 20mg  BID at prior visit in addition to topiramate but noted low HR and fatigue therefore lowered to 10mg  BID, amitriptyline 10mg  nightly added due to continued migraines but reported dizziness and constipation after starting. Has been on topiramate for a while now but does feel this affects her mood and memory, mood  improved some with amitriptyline.  Migraine frequency did overall some but still present. She had about 4-5 migraines in September, can last up to 2 days and has had a daily migraine over the past week. Has been occurring in the evening, will use rizatriptan with benefit.     History provided for reference purposes only Update 04/14/2023 JM: Patient returns for follow-up visit.    Reports gradual worsening of migraines over the past several months.  Reports 3 migraines in the month of March, 5 migraines in the month of April, 8 in the month of May and June and 12 in the month of July.  Was found to have ear infection in April and July. Not currently having an issue with ear infection or symptoms.  She has noticed warm weather or prolonged sun exposure as a trigger, she has since been ensuring she hydrates well, utilizes shade more often and use of a hat which has helped prevent migraine from occurring. She has been waking up more often with migraines during the month of July. Use of rizatriptan 5 mg for mild migraines and 10 mg for severe migraines with benefit.  She continues on topiramate 100 mg nightly but concerned of side effects such as forgetfulness, difficult focusing/concentrating and GI issues.  Patient does have history of hypertension previously on hydrochlorothiazide, has not needed medication treatment in many years, blood pressure today satisfactory, at times can be elevated but does not correlate with migraine headache, does not routinely monitor at home.  Update 10/14/2022 JM: Returns for migraine follow-up unaccompanied.  Reports great improvement of migraine headaches currently on topiramate 100 mg nightly. Reports  4-5 migraines during the months of Oct-Dec but only 1 migraine headache in the month of January. Does report some side effects on topiramate including GI side effects, smell/taste inversions, forgetfulness and decreased appetite but believes benefit of medication outweighs the side  effects. Used rizatriptan 1x during the month of October which aborted headache after about 5 minutes, headaches have not been severe enough since that time to repeat needed rizatriptan.  Typically last 15 to 30 minutes and resolved without intervention.  Rizatriptan does cause fatigue but typically only gets headaches at night.  She continues to experience right facial cold sensation and mild RUE and RLE numbness prior to migraine onset, will occasionally have these symptoms (more so facial symptoms) in the evening but will not evolve into a migraine and will resolve after short duration. Onset of these symptoms 04/2021, MRI at that time unremarkable. Does report Dr. Frances Furbish recommended carotid ultrasound to be completed due to these symptoms, advised to wait til today to discuss this.   Evaluated by Dr. Frances Furbish for possible underlying sleep apnea on 07/21/2022, recommend completing sleep study which is currently scheduled next week.     Consult visit 06/17/2022 Dr. Terrace Arabia: Betty Hall, is a 47 year old female seen in request by her primary care physician Dr. Abbe Amsterdam for evaluation of migraine headache, initial evaluation was on June 17, 2022  I reviewed and summarized the referring note. PMHX. Mirena replacement in Sept, 2023 Chronic migraine.  She noticed gradual increased migraine headaches since April, by September 2023, she has headache 2-3 times each week, always on the right side, severe pressure pain, lasting 45 minutes to 1 hour, she wants dark quiet room resting for a while during headache  Some of the headache woke her up from sleep, severe pounding on the right side, fell right facial swelling, but it would go away within 1 hour  Personally reviewed MRI of the brain with without contrast April 16, 2021 that was normal MRI of cervical spine in August 2022 showed mild degenerative changes, most noticeable at C5-6 with moderate canal severe bilateral foraminal stenosis, no evidence  of cord compression, C3-4, moderate right foraminal stenosis  I saw patient in August 2022, at that time she complains of right neck pain, right hand paresthesia following heavy lifting, now has much improved   She did have frequent migraine at high school, describes holoacranial headache at that time, with significant light noise sensitivity, nauseous  Because of the headache, she wake up frequently, complains of daytime sleepiness, fatigue, does have snoring sometimes   PHYSICAL EXAM:  N/A d/t visit type     REVIEW OF SYSTEMS:  Full 14 system review of systems performed and notable only for as above All other review of systems were negative.   ALLERGIES: Allergies  Allergen Reactions   Prednisone Hypertension    HOME MEDICATIONS: Current Outpatient Medications  Medication Sig Dispense Refill   amitriptyline (ELAVIL) 10 MG tablet TAKE 1 TABLET BY MOUTH EVERYDAY AT BEDTIME 90 tablet 2   fluconazole (DIFLUCAN) 150 MG tablet Take 1 tablet as directed; repeat after 72 hours 2 tablet 0   levonorgestrel (MIRENA) 20 MCG/DAY IUD 1 each by Intrauterine route once.     ondansetron (ZOFRAN) 4 MG tablet Take 1 tablet (4 mg total) by mouth every 8 (eight) hours as needed for nausea or vomiting. 20 tablet 3   propranolol (INDERAL) 10 MG tablet Take 1 tablet (10 mg total) by mouth 2 (two) times daily. 60 tablet 3  rizatriptan (MAXALT-MLT) 10 MG disintegrating tablet Take 1 tablet (10 mg total) by mouth as needed. May repeat in 2 hours if needed 12 tablet 6   rizatriptan (MAXALT-MLT) 5 MG disintegrating tablet May repeat in 2 hours if needed 10 tablet 12   topiramate (TOPAMAX) 100 MG tablet Take 1 tablet (100 mg total) by mouth at bedtime. 90 tablet 3   No current facility-administered medications for this visit.    PAST MEDICAL HISTORY: Past Medical History:  Diagnosis Date   Hypertension    Migraines    Osteoarthritis of facet joint at L5-S1 level of lumbosacral spine    herniated  & buldging disc    PAST SURGICAL HISTORY: Past Surgical History:  Procedure Laterality Date   CERVICAL BIOPSY  W/ LOOP ELECTRODE EXCISION  09/13/1997   CIN II   GALLBLADDER SURGERY  09/13/2000   INTRAUTERINE DEVICE INSERTION     mirena inserted 09-24-16   knee surgeries  2011, 2010, 2009   KNEE SURGERY Left 10/15/2015    FAMILY HISTORY: Family History  Problem Relation Age of Onset   Hypertension Father    Heart disease Maternal Grandmother    Diabetes Maternal Grandmother    Rheum arthritis Maternal Grandmother    Cancer Maternal Grandfather        COLON   Hypertension Paternal Grandfather     SOCIAL HISTORY: Social History   Socioeconomic History   Marital status: Married    Spouse name: Leighton Parody   Number of children: Not on file   Years of education: Not on file   Highest education level: Bachelor's degree (e.g., BA, AB, BS)  Occupational History   Not on file  Tobacco Use   Smoking status: Former    Current packs/day: 0.50    Average packs/day: 0.5 packs/day for 15.1 years (7.5 ttl pk-yrs)    Types: Cigarettes    Start date: 05/16/2008   Smokeless tobacco: Never  Vaping Use   Vaping status: Some Days  Substance and Sexual Activity   Alcohol use: Yes    Comment: SOCIAL   Drug use: No   Sexual activity: Yes    Partners: Male    Birth control/protection: I.U.D.    Comment: MIRENA. 1st intercourse- 16, partners- 5  Other Topics Concern   Not on file  Social History Narrative   Lives with husband and kids   Right Handed   Drinks 4-5 cups caffeine daily. Nothing after 0830.   Working:  Arboriculturist. In IT.   Social Determinants of Health   Financial Resource Strain: Low Risk  (01/26/2023)   Overall Financial Resource Strain (CARDIA)    Difficulty of Paying Living Expenses: Not hard at all  Food Insecurity: No Food Insecurity (01/26/2023)   Hunger Vital Sign    Worried About Running Out of Food in the Last Year: Never true    Ran Out of  Food in the Last Year: Never true  Transportation Needs: No Transportation Needs (01/26/2023)   PRAPARE - Administrator, Civil Service (Medical): No    Lack of Transportation (Non-Medical): No  Physical Activity: Insufficiently Active (01/26/2023)   Exercise Vital Sign    Days of Exercise per Week: 5 days    Minutes of Exercise per Session: 10 min  Stress: No Stress Concern Present (01/26/2023)   [employer] of Occupational Health - Occupational Stress Questionnaire    Feeling of Stress : Not at all  Social Connections: Moderately Integrated (01/26/2023)   Social Connection  and Isolation Panel [NHANES]    Frequency of Communication with Friends and Family: More than three times a week    Frequency of Social Gatherings with Friends and Family: Once a week    Attends Religious Services: 1 to 4 times per year    Active Member of [employer] or Organizations: No    Attends Engineer, structural: Not on file    Marital Status: Married  Catering manager Violence: Not on file      I spent 25 minutes of face-to-face and non-face-to-face time with patient via MyChart video visit.  This included previsit chart review, lab review, study review, order entry, electronic health record documentation, patient education and discussion regarding above diagnoses and treatment plan and answered all other questions to patient satisfaction  Ihor Austin, [facility]  [facility] [address] [place], [place] [address]  Phone [phone] Fax [phone] Note: This document was prepared with digital dictation and possible smart phrase technology. Any transcriptional errors that result from this process are unintentional.

## 2023-06-22 ENCOUNTER — Encounter: Payer: Self-pay | Admitting: Adult Health

## 2023-06-22 ENCOUNTER — Telehealth: Payer: 59 | Admitting: Adult Health

## 2023-06-22 ENCOUNTER — Other Ambulatory Visit (HOSPITAL_BASED_OUTPATIENT_CLINIC_OR_DEPARTMENT_OTHER): Payer: Self-pay

## 2023-06-22 ENCOUNTER — Other Ambulatory Visit: Payer: Self-pay

## 2023-06-22 DIAGNOSIS — G43709 Chronic migraine without aura, not intractable, without status migrainosus: Secondary | ICD-10-CM | POA: Diagnosis not present

## 2023-06-22 MED ORDER — TOPIRAMATE 50 MG PO TABS
50.0000 mg | ORAL_TABLET | Freq: Every day | ORAL | 5 refills | Status: DC
  Filled 2023-06-22: qty 30, 30d supply, fill #0

## 2023-06-22 MED ORDER — QULIPTA 60 MG PO TABS
60.0000 mg | ORAL_TABLET | Freq: Every day | ORAL | 11 refills | Status: DC
  Filled 2023-06-22: qty 30, 30d supply, fill #0
  Filled 2023-07-29: qty 30, 30d supply, fill #1
  Filled 2023-08-29: qty 30, 30d supply, fill #2
  Filled 2023-09-26 – 2023-09-28 (×2): qty 30, 30d supply, fill #3
  Filled 2023-10-31: qty 30, 30d supply, fill #4
  Filled 2023-11-26 (×2): qty 30, 30d supply, fill #5
  Filled 2023-12-29: qty 30, 30d supply, fill #6
  Filled 2024-01-25: qty 30, 30d supply, fill #7
  Filled 2024-03-02: qty 30, 30d supply, fill #8
  Filled 2024-04-02 (×2): qty 30, 30d supply, fill #9

## 2023-06-22 NOTE — Patient Instructions (Addendum)
Your Plan:  Recommend starting Qulipta 60mg  daily for headache prevention   After taking Qulipta for 1 week, you can decrease your topiramate to 50 mg nightly. If doing well, can stop after 1-2 weeks. If migraines worsen, please return to prior dosage   Can consider stopping amitriptyline and propranolol eventually if migraines improve  If migraines persist after 4 weeks on Qulipta, can consider switching to monthly injectable such as Emgality  Continue rizatriptan as needed for migraine rescue     Follow-up as scheduled in February or call earlier if needed       Thank you for coming to see [place] at [facility]. I hope we have been able to provide you high quality care today.  You may receive a patient satisfaction survey over the next few weeks. We would appreciate your feedback and comments so that we may continue to improve ourselves and the health of our patients.

## 2023-06-23 ENCOUNTER — Encounter (HOSPITAL_BASED_OUTPATIENT_CLINIC_OR_DEPARTMENT_OTHER): Payer: Self-pay

## 2023-06-23 ENCOUNTER — Other Ambulatory Visit (HOSPITAL_BASED_OUTPATIENT_CLINIC_OR_DEPARTMENT_OTHER): Payer: Self-pay

## 2023-06-29 ENCOUNTER — Other Ambulatory Visit (HOSPITAL_BASED_OUTPATIENT_CLINIC_OR_DEPARTMENT_OTHER): Payer: Self-pay

## 2023-07-01 ENCOUNTER — Telehealth: Payer: Self-pay

## 2023-07-01 NOTE — Telephone Encounter (Signed)
*  [facility]  Pharmacy Patient Advocate Encounter   Received notification from CoverMyMeds that prior authorization for Qulipta 60MG  tablets  is required/requested.   Insurance verification completed.   The patient is insured through [facility] .   Per test claim: PA required; PA submitted to [facility] via CoverMyMeds Key/confirmation #/EOC [id number] Status is pending

## 2023-07-02 ENCOUNTER — Other Ambulatory Visit (HOSPITAL_BASED_OUTPATIENT_CLINIC_OR_DEPARTMENT_OTHER): Payer: Self-pay

## 2023-07-08 ENCOUNTER — Other Ambulatory Visit (HOSPITAL_COMMUNITY): Payer: Self-pay

## 2023-07-08 NOTE — Telephone Encounter (Signed)
Pharmacy Patient Advocate Encounter  Received notification from [facility] that Prior Authorization for Bennie Pierini has been APPROVED from 07/01/2023 to 06/30/2024. Ran test claim, Copay is $refill too soon. This test claim was processed through [facility]- copay amounts may vary at other pharmacies due to pharmacy/plan contracts, or as the patient moves through the different stages of their insurance plan.

## 2023-07-18 ENCOUNTER — Other Ambulatory Visit: Payer: Self-pay | Admitting: Adult Health

## 2023-07-18 MED ORDER — AMITRIPTYLINE HCL 10 MG PO TABS: 10.0000 mg | ORAL_TABLET | Freq: Every day | ORAL | 5 refills | Status: DC

## 2023-07-18 MED ORDER — PROPRANOLOL HCL 10 MG PO TABS: 10.0000 mg | ORAL_TABLET | Freq: Two times a day (BID) | ORAL | 3 refills | Status: DC

## 2023-08-06 ENCOUNTER — Other Ambulatory Visit: Payer: Self-pay | Admitting: Family Medicine

## 2023-08-06 DIAGNOSIS — Z1231 Encounter for screening mammogram for malignant neoplasm of breast: Secondary | ICD-10-CM

## 2023-08-16 ENCOUNTER — Ambulatory Visit (INDEPENDENT_AMBULATORY_CARE_PROVIDER_SITE_OTHER): Payer: 59 | Admitting: Obstetrics and Gynecology

## 2023-08-16 ENCOUNTER — Encounter: Payer: Self-pay | Admitting: Obstetrics and Gynecology

## 2023-08-16 ENCOUNTER — Other Ambulatory Visit (HOSPITAL_COMMUNITY)
Admission: RE | Admit: 2023-08-16 | Discharge: 2023-08-16 | Disposition: A | Discharge status: Home / Self Care | Payer: 59 | Source: Ambulatory Visit | Attending: Obstetrics and Gynecology | Admitting: Obstetrics and Gynecology

## 2023-08-16 VITALS — BP 132/80 | HR 78 | Ht 65.5 in | Wt 136.0 lb

## 2023-08-16 DIAGNOSIS — Z124 Encounter for screening for malignant neoplasm of cervix: Secondary | ICD-10-CM | POA: Diagnosis present

## 2023-08-16 DIAGNOSIS — Z01419 Encounter for gynecological examination (general) (routine) without abnormal findings: Secondary | ICD-10-CM | POA: Diagnosis not present

## 2023-08-16 DIAGNOSIS — Z975 Presence of (intrauterine) contraceptive device: Secondary | ICD-10-CM | POA: Insufficient documentation

## 2023-08-16 NOTE — Patient Instructions (Signed)

## 2023-08-16 NOTE — Progress Notes (Signed)
47 y.o. [id number] female with Mirena IUD (08/23), h/o LEEP (CIN2, 1999), migraines (sees neurology), and secondary polycythemia (sees hematology)  here for annual exam. Married.  No complaints Reports hematologist recommend removing Mirena IUD to allow for menses to aid with polycythemia. Patient does not want to do this.  No LMP recorded. (Menstrual status: IUD).    Abnormal bleeding: none Pelvic discharge or pain: none Breast mass, nipple discharge or skin changes : none Birth control: Mirena Last PAP:     Component Value Date/Time   DIAGPAP  07/27/2022 1453    - Negative for Intraepithelial Lesions or Malignancy (NILM)   DIAGPAP  07/27/2022 1453    - Benign reactive/reparative changes associated with intrauterine device   DIAGPAP (IUD) 07/27/2022 1453   ADEQPAP  07/27/2022 1453    Satisfactory for evaluation; transformation zone component PRESENT.   ADEQPAP  06/02/2021 0844    Satisfactory for evaluation; transformation zone component PRESENT.   Last mammogram: 08/26/22 BIRADS 1, density c Last colonoscopy: 2024, q30yr Sexually active: yes  Exercising: yes, walks  GYN HISTORY: Hx of CIN 2 s/p LEEP, 1999  OB History  Gravida Para Term Preterm AB Living  3 2 2  0 1 2  SAB IAB Ectopic Multiple Live Births  1 0 0 0      # Outcome Date GA Lbr Len/2nd Weight Sex Type Anes PTL Lv  3 Term 08/06/11 [redacted]w[redacted]d 04:33 / 00:03 6 lb 2.1 oz (2.781 kg) F Vag-Spont None    2 Term           1 SAB             Past Medical History:  Diagnosis Date   Hypertension    Migraines    Osteoarthritis of facet joint at L5-S1 level of lumbosacral spine    herniated & buldging disc    Past Surgical History:  Procedure Laterality Date   CERVICAL BIOPSY  W/ LOOP ELECTRODE EXCISION  09/13/1997   CIN II   GALLBLADDER SURGERY  09/13/2000   INTRAUTERINE DEVICE INSERTION     mirena inserted 09-24-16   knee surgeries  2011, 2010, 2009   KNEE SURGERY Left 10/15/2015    Current Outpatient  Medications on File Prior to Visit  Medication Sig Dispense Refill   Atogepant (QULIPTA) 60 MG TABS Take 1 tablet (60 mg total) by mouth daily. 30 tablet 11   levonorgestrel (MIRENA) 20 MCG/DAY IUD 1 each by Intrauterine route once.     ondansetron (ZOFRAN) 4 MG tablet Take 1 tablet (4 mg total) by mouth every 8 (eight) hours as needed for nausea or vomiting. 20 tablet 3   propranolol (INDERAL) 10 MG tablet Take 1 tablet (10 mg total) by mouth 2 (two) times daily. 60 tablet 3   rizatriptan (MAXALT-MLT) 10 MG disintegrating tablet Take 1 tablet (10 mg total) by mouth as needed. May repeat in 2 hours if needed 12 tablet 6   rizatriptan (MAXALT-MLT) 5 MG disintegrating tablet May repeat in 2 hours if needed 10 tablet 12   No current facility-administered medications on file prior to visit.    Social History   Socioeconomic History   Marital status: Married    Spouse name: Leighton Parody   Number of children: Not on file   Years of education: Not on file   Highest education level: Bachelor's degree (e.g., BA, AB, BS)  Occupational History   Not on file  Tobacco Use   Smoking status: Former    Current  packs/day: 0.50    Average packs/day: 0.5 packs/day for 15.2 years (7.6 ttl pk-yrs)    Types: Cigarettes    Start date: 05/16/2008   Smokeless tobacco: Never  Vaping Use   Vaping status: Some Days  Substance and Sexual Activity   Alcohol use: Yes    Comment: SOCIAL   Drug use: No   Sexual activity: Yes    Partners: Male    Birth control/protection: I.U.D.    Comment: MIRENA. 1st intercourse- 16, partners- 5  Other Topics Concern   Not on file  Social History Narrative   Lives with husband and kids   Right Handed   Drinks 4-5 cups caffeine daily. Nothing after 0830.   Working:  Arboriculturist. In IT.   Social Determinants of Health   Financial Resource Strain: Low Risk  (01/26/2023)   Overall Financial Resource Strain (CARDIA)    Difficulty of Paying Living Expenses: Not  hard at all  Food Insecurity: No Food Insecurity (01/26/2023)   Hunger Vital Sign    Worried About Running Out of Food in the Last Year: Never true    Ran Out of Food in the Last Year: Never true  Transportation Needs: No Transportation Needs (01/26/2023)   PRAPARE - Administrator, Civil Service (Medical): No    Lack of Transportation (Non-Medical): No  Physical Activity: Insufficiently Active (01/26/2023)   Exercise Vital Sign    Days of Exercise per Week: 5 days    Minutes of Exercise per Session: 10 min  Stress: No Stress Concern Present (01/26/2023)   [employer] of Occupational Health - Occupational Stress Questionnaire    Feeling of Stress : Not at all  Social Connections: Moderately Integrated (01/26/2023)   Social Connection and Isolation Panel [NHANES]    Frequency of Communication with Friends and Family: More than three times a week    Frequency of Social Gatherings with Friends and Family: Once a week    Attends Religious Services: 1 to 4 times per year    Active Member of [employer] or Organizations: No    Attends Engineer, structural: Not on file    Marital Status: Married  Catering manager Violence: Not on file    Family History  Problem Relation Age of Onset   Hypertension Father    Heart disease Maternal Grandmother    Diabetes Maternal Grandmother    Rheum arthritis Maternal Grandmother    Cancer Maternal Grandfather        COLON   Hypertension Paternal Grandfather     Allergies  Allergen Reactions   Prednisone Hypertension      PE Today's Vitals   08/16/23 0816  BP: 132/80  Pulse: 78  SpO2: 100%  Weight: 136 lb (61.7 kg)  Height: 5' 5.5" (1.664 m)   Body mass index is 22.29 kg/m.  Physical Exam Vitals reviewed. Exam conducted with a chaperone present.  Constitutional:      General: She is not in acute distress.    Appearance: Normal appearance.  HENT:     Head: Normocephalic and atraumatic.     Nose: Nose normal.   Eyes:     Extraocular Movements: Extraocular movements intact.     Conjunctiva/sclera: Conjunctivae normal.  Neck:     Thyroid: No thyroid mass, thyromegaly or thyroid tenderness.  Pulmonary:     Effort: Pulmonary effort is normal.  Chest:     Chest wall: No mass or tenderness.  Breasts:    Right: Normal. No  swelling, mass, nipple discharge, skin change or tenderness.     Left: Normal. No swelling, mass, nipple discharge, skin change or tenderness.  Abdominal:     General: There is no distension.     Palpations: Abdomen is soft.     Tenderness: There is no abdominal tenderness.  Genitourinary:    General: Normal vulva.     Exam position: Lithotomy position.     Urethra: No prolapse.     Vagina: Normal. No vaginal discharge or bleeding.     Cervix: Normal. No lesion.     Uterus: Normal. Not enlarged and not tender.      Adnexa: Right adnexa normal and left adnexa normal.     Comments: +IUD strings Musculoskeletal:        General: Normal range of motion.     Cervical back: Normal range of motion.  Lymphadenopathy:     Upper Body:     Right upper body: No axillary adenopathy.     Left upper body: No axillary adenopathy.     Lower Body: No right inguinal adenopathy. No left inguinal adenopathy.  Skin:    General: Skin is warm and dry.  Neurological:     General: No focal deficit present.     Mental Status: She is alert.  Psychiatric:        Mood and Affect: Mood normal.        Behavior: Behavior normal.       Assessment and Plan:        Well woman exam with routine gynecological exam Assessment & Plan: Cervical cancer screening performed according to ASCCP guidelines, desires yearly PAP. Encouraged annual mammogram screening Colonoscopy UTD DXA N/A Labs and immunizations with her primary Encouraged safe sexual practices as indicated Encouraged healthy lifestyle practices with diet and exercise For patients under 50yo, I recommend 1000mg  calcium daily and 600IU of  vitamin D daily.    Cervical cancer screening -     Cytology - PAP  Uses hormone releasing intrauterine device (IUD) for contraception    Rosalyn Gess, MD

## 2023-08-16 NOTE — Assessment & Plan Note (Signed)
Cervical cancer screening performed according to ASCCP guidelines, desires yearly PAP. Encouraged annual mammogram screening Colonoscopy UTD DXA N/A Labs and immunizations with her primary Encouraged safe sexual practices as indicated Encouraged healthy lifestyle practices with diet and exercise For patients under 46yo, I recommend 1000mg  calcium daily and 600IU of vitamin D daily.

## 2023-08-18 LAB — CYTOLOGY - PAP
Comment: NEGATIVE
Diagnosis: NEGATIVE
Diagnosis: REACTIVE
High risk HPV: NEGATIVE

## 2023-08-19 ENCOUNTER — Other Ambulatory Visit: Payer: Self-pay

## 2023-08-19 ENCOUNTER — Other Ambulatory Visit (HOSPITAL_BASED_OUTPATIENT_CLINIC_OR_DEPARTMENT_OTHER): Payer: Self-pay

## 2023-08-19 DIAGNOSIS — N760 Acute vaginitis: Secondary | ICD-10-CM

## 2023-08-19 MED ORDER — METRONIDAZOLE 0.75 % VA GEL
1.0000 | Freq: Every day | VAGINAL | 0 refills | Status: AC
  Filled 2023-08-19: qty 70, 7d supply, fill #0

## 2023-08-30 ENCOUNTER — Other Ambulatory Visit (HOSPITAL_BASED_OUTPATIENT_CLINIC_OR_DEPARTMENT_OTHER): Payer: Self-pay

## 2023-09-02 ENCOUNTER — Ambulatory Visit
Admission: RE | Admit: 2023-09-02 | Discharge: 2023-09-02 | Disposition: A | Discharge status: Home / Self Care | Payer: 59 | Source: Ambulatory Visit

## 2023-09-02 DIAGNOSIS — Z1231 Encounter for screening mammogram for malignant neoplasm of breast: Secondary | ICD-10-CM

## 2023-09-17 ENCOUNTER — Encounter: Payer: Self-pay | Admitting: Adult Health

## 2023-09-28 ENCOUNTER — Other Ambulatory Visit (HOSPITAL_BASED_OUTPATIENT_CLINIC_OR_DEPARTMENT_OTHER): Payer: Self-pay

## 2023-10-04 NOTE — Progress Notes (Unsigned)
Virtual Visit via Video Note  I connected with Betty Hall on 10/04/23 at  8:15 AM EST by a video enabled telemedicine application and verified that I am speaking with the correct person using two identifiers.  Location: Patient: in home Provider: in office   I discussed the limitations of evaluation and management by telemedicine and the availability of in person appointments. The patient expressed understanding and agreed to proceed.     ASSESSMENT AND PLAN  Betty Hall is a 48 y.o. female   Chronic migraine headache Transient right sided numbness likely sensory aura  Currently having 8-10 migraine days per month on propranolol, amitriptyline and topiramate, this past week daily migraine. Unable to increase dosages due to side effects Recommend initiating Qulipta 60 mg daily for migraine prevention  Advised to decrease topiramate to 50mg  nightly 1 week after starting Qulipta. Can discontinue if doing well after 1-2 weeks. Can further consider stopping amitriptyline and propranolol if migraines improve  Next step: Emgality, Ajovy, botox  Continue Maxalt 5 mg as needed for mild headache and 10mg  for severe headache  MR brain 04/2021 unremarkable  Carotid ultrasound bilateral near normal vessels      Follow-up as scheduled in February or call earlier if needed    DIAGNOSTIC DATA (LABS, IMAGING, TESTING) - I reviewed patient records, labs, notes, testing and imaging myself where available.   MEDICAL HISTORY:  Update 10/05/2023 JM: Patient returns for follow-up visit.  At prior visit, she was started on Qulipta and discussed gradual tapering of amitriptyline, propranolol and topiramate due to side effects.        History provided for reference purposes only Update 06/22/2023 JM: Patient returns for sooner scheduled visit to discuss medication changes via MyChart video visit.  Was placed on propranolol 20mg  BID at prior visit in addition to topiramate but noted  low HR and fatigue therefore lowered to 10mg  BID, amitriptyline 10mg  nightly added due to continued migraines but reported dizziness and constipation after starting. Has been on topiramate for a while now but does feel this affects her mood and memory, mood improved some with amitriptyline.  Migraine frequency did overall some but still present. She had about 4-5 migraines in September, can last up to 2 days and has had a daily migraine over the past week. Has been occurring in the evening, will use rizatriptan with benefit.   Update 04/14/2023 JM: Patient returns for follow-up visit.    Reports gradual worsening of migraines over the past several months.  Reports 3 migraines in the month of March, 5 migraines in the month of April, 8 in the month of May and June and 12 in the month of July.  Was found to have ear infection in April and July. Not currently having an issue with ear infection or symptoms.  She has noticed warm weather or prolonged sun exposure as a trigger, she has since been ensuring she hydrates well, utilizes shade more often and use of a hat which has helped prevent migraine from occurring. She has been waking up more often with migraines during the month of July. Use of rizatriptan 5 mg for mild migraines and 10 mg for severe migraines with benefit.  She continues on topiramate 100 mg nightly but concerned of side effects such as forgetfulness, difficult focusing/concentrating and GI issues.  Patient does have history of hypertension previously on hydrochlorothiazide, has not needed medication treatment in many years, blood pressure today satisfactory, at times can be elevated but  does not correlate with migraine headache, does not routinely monitor at home.  Update 10/14/2022 JM: Returns for migraine follow-up unaccompanied.  Reports great improvement of migraine headaches currently on topiramate 100 mg nightly. Reports 4-5 migraines during the months of Oct-Dec but only 1 migraine headache in  the month of January. Does report some side effects on topiramate including GI side effects, smell/taste inversions, forgetfulness and decreased appetite but believes benefit of medication outweighs the side effects. Used rizatriptan 1x during the month of October which aborted headache after about 5 minutes, headaches have not been severe enough since that time to repeat needed rizatriptan.  Typically last 15 to 30 minutes and resolved without intervention.  Rizatriptan does cause fatigue but typically only gets headaches at night.  She continues to experience right facial cold sensation and mild RUE and RLE numbness prior to migraine onset, will occasionally have these symptoms (more so facial symptoms) in the evening but will not evolve into a migraine and will resolve after short duration. Onset of these symptoms 04/2021, MRI at that time unremarkable. Does report Dr. Frances Furbish recommended carotid ultrasound to be completed due to these symptoms, advised to wait til today to discuss this.   Evaluated by Dr. Frances Furbish for possible underlying sleep apnea on 07/21/2022, recommend completing sleep study which is currently scheduled next week.     Consult visit 06/17/2022 Dr. Terrace Arabia: Betty Hall, is a 48 year old female seen in request by her primary care physician Dr. Abbe Amsterdam for evaluation of migraine headache, initial evaluation was on June 17, 2022  I reviewed and summarized the referring note. PMHX. Mirena replacement in Sept, 2023 Chronic migraine.  She noticed gradual increased migraine headaches since April, by September 2023, she has headache 2-3 times each week, always on the right side, severe pressure pain, lasting 45 minutes to 1 hour, she wants dark quiet room resting for a while during headache  Some of the headache woke her up from sleep, severe pounding on the right side, fell right facial swelling, but it would go away within 1 hour  Personally reviewed MRI of the brain with  without contrast April 16, 2021 that was normal MRI of cervical spine in August 2022 showed mild degenerative changes, most noticeable at C5-6 with moderate canal severe bilateral foraminal stenosis, no evidence of cord compression, C3-4, moderate right foraminal stenosis  I saw patient in August 2022, at that time she complains of right neck pain, right hand paresthesia following heavy lifting, now has much improved   She did have frequent migraine at high school, describes holoacranial headache at that time, with significant light noise sensitivity, nauseous  Because of the headache, she wake up frequently, complains of daytime sleepiness, fatigue, does have snoring sometimes   PHYSICAL EXAM:  N/A d/t visit type     REVIEW OF SYSTEMS:  Full 14 system review of systems performed and notable only for as above All other review of systems were negative.   ALLERGIES: Allergies  Allergen Reactions   Prednisone Hypertension    HOME MEDICATIONS: Current Outpatient Medications  Medication Sig Dispense Refill   Atogepant (QULIPTA) 60 MG TABS Take 1 tablet (60 mg total) by mouth daily. 30 tablet 11   levonorgestrel (MIRENA) 20 MCG/DAY IUD 1 each by Intrauterine route once.     ondansetron (ZOFRAN) 4 MG tablet Take 1 tablet (4 mg total) by mouth every 8 (eight) hours as needed for nausea or vomiting. 20 tablet 3   propranolol (INDERAL) 10 MG  tablet Take 1 tablet (10 mg total) by mouth 2 (two) times daily. 60 tablet 3   rizatriptan (MAXALT-MLT) 10 MG disintegrating tablet Take 1 tablet (10 mg total) by mouth as needed. May repeat in 2 hours if needed 12 tablet 6   rizatriptan (MAXALT-MLT) 5 MG disintegrating tablet May repeat in 2 hours if needed 10 tablet 12   No current facility-administered medications for this visit.    PAST MEDICAL HISTORY: Past Medical History:  Diagnosis Date   Hypertension    Migraines    Osteoarthritis of facet joint at L5-S1 level of lumbosacral spine     herniated & buldging disc    PAST SURGICAL HISTORY: Past Surgical History:  Procedure Laterality Date   CERVICAL BIOPSY  W/ LOOP ELECTRODE EXCISION  09/13/1997   CIN II   GALLBLADDER SURGERY  09/13/2000   INTRAUTERINE DEVICE INSERTION     mirena inserted 09-24-16   knee surgeries  2011, 2010, 2009   KNEE SURGERY Left 10/15/2015    FAMILY HISTORY: Family History  Problem Relation Age of Onset   Hypertension Father    Heart disease Maternal Grandmother    Diabetes Maternal Grandmother    Rheum arthritis Maternal Grandmother    Cancer Maternal Grandfather        COLON   Hypertension Paternal Grandfather     SOCIAL HISTORY: Social History   Socioeconomic History   Marital status: Married    Spouse name: Leighton Parody   Number of children: Not on file   Years of education: Not on file   Highest education level: Bachelor's degree (e.g., BA, AB, BS)  Occupational History   Not on file  Tobacco Use   Smoking status: Former    Current packs/day: 0.50    Average packs/day: 0.5 packs/day for 15.4 years (7.7 ttl pk-yrs)    Types: Cigarettes    Start date: 05/16/2008   Smokeless tobacco: Never  Vaping Use   Vaping status: Some Days  Substance and Sexual Activity   Alcohol use: Yes    Comment: SOCIAL   Drug use: No   Sexual activity: Yes    Partners: Male    Birth control/protection: I.U.D.    Comment: MIRENA. 1st intercourse- 16, partners- 5  Other Topics Concern   Not on file  Social History Narrative   Lives with husband and kids   Right Handed   Drinks 4-5 cups caffeine daily. Nothing after 0830.   Working:  Arboriculturist. In IT.   Social Drivers of Corporate investment banker Strain: Low Risk  (01/26/2023)   Overall Financial Resource Strain (CARDIA)    Difficulty of Paying Living Expenses: Not hard at all  Food Insecurity: No Food Insecurity (01/26/2023)   Hunger Vital Sign    Worried About Running Out of Food in the Last Year: Never true    Ran Out  of Food in the Last Year: Never true  Transportation Needs: No Transportation Needs (01/26/2023)   PRAPARE - Administrator, Civil Service (Medical): No    Lack of Transportation (Non-Medical): No  Physical Activity: Insufficiently Active (01/26/2023)   Exercise Vital Sign    Days of Exercise per Week: 5 days    Minutes of Exercise per Session: 10 min  Stress: No Stress Concern Present (01/26/2023)   [employer] of Occupational Health - Occupational Stress Questionnaire    Feeling of Stress : Not at all  Social Connections: Moderately Integrated (01/26/2023)   Social Connection and Isolation  Panel [NHANES]    Frequency of Communication with Friends and Family: More than three times a week    Frequency of Social Gatherings with Friends and Family: Once a week    Attends Religious Services: 1 to 4 times per year    Active Member of [employer] or Organizations: No    Attends Engineer, structural: Not on file    Marital Status: Married  Catering manager Violence: Not on file      I spent 25 minutes of face-to-face and non-face-to-face time with patient via MyChart video visit.  This included previsit chart review, lab review, study review, order entry, electronic health record documentation, patient education and discussion regarding above diagnoses and treatment plan and answered all other questions to patient satisfaction  Ihor Austin, [facility]  [facility] [address] [place], [place] [address]  Phone [phone] Fax [phone] Note: This document was prepared with digital dictation and possible smart phrase technology. Any transcriptional errors that result from this process are unintentional.

## 2023-10-05 ENCOUNTER — Ambulatory Visit: Payer: 59 | Admitting: Adult Health

## 2023-10-05 ENCOUNTER — Telehealth: Payer: Self-pay | Admitting: *Deleted

## 2023-10-05 ENCOUNTER — Other Ambulatory Visit (HOSPITAL_BASED_OUTPATIENT_CLINIC_OR_DEPARTMENT_OTHER): Payer: Self-pay

## 2023-10-05 ENCOUNTER — Encounter: Payer: Self-pay | Admitting: Adult Health

## 2023-10-05 ENCOUNTER — Telehealth: Payer: Self-pay | Admitting: Adult Health

## 2023-10-05 VITALS — BP 147/89 | HR 70 | Ht 61.0 in | Wt 137.0 lb

## 2023-10-05 DIAGNOSIS — G43709 Chronic migraine without aura, not intractable, without status migrainosus: Secondary | ICD-10-CM | POA: Diagnosis not present

## 2023-10-05 MED ORDER — RIZATRIPTAN BENZOATE 10 MG PO TBDP
10.0000 mg | ORAL_TABLET | ORAL | 11 refills | Status: DC | PRN
  Filled 2023-10-05: qty 10, 20d supply, fill #0
  Filled 2023-11-26: qty 10, 30d supply, fill #0
  Filled 2024-03-10: qty 10, 30d supply, fill #1
  Filled 2024-04-28: qty 10, 30d supply, fill #2
  Filled 2024-06-05: qty 10, 30d supply, fill #3
  Filled 2024-06-27: qty 10, 30d supply, fill #4

## 2023-10-05 MED ORDER — RIZATRIPTAN BENZOATE 5 MG PO TBDP
ORAL_TABLET | ORAL | 12 refills | Status: DC
  Filled 2023-10-05: qty 10, fill #0
  Filled 2023-10-31: qty 10, 5d supply, fill #0
  Filled 2023-11-26 (×2): qty 10, 5d supply, fill #1
  Filled 2023-12-29: qty 10, 5d supply, fill #2
  Filled 2024-02-23 – 2024-04-02 (×3): qty 10, 5d supply, fill #3
  Filled 2024-04-28: qty 10, 5d supply, fill #4
  Filled 2024-06-05: qty 10, 5d supply, fill #5
  Filled 2024-06-27: qty 10, 5d supply, fill #6

## 2023-10-05 MED ORDER — BOTOX 200 UNITS IJ SOLR: 155.0000 [IU] | INTRAMUSCULAR | 3 refills | Status: DC

## 2023-10-05 NOTE — Telephone Encounter (Signed)
Duplicate, addressed in previous Botox encounter.

## 2023-10-05 NOTE — Telephone Encounter (Signed)
Submitted auth request via [employer] portal, received instant approval. Please send rx to [facility], thank you!  Auth#: [id number] (10/05/23-10/04/24)

## 2023-10-05 NOTE — Patient Instructions (Addendum)
Your Plan:  Recommend starting botox - will work on getting this approved   Continue Qulipta 60mg  daily  Continue Maxalt as needed         Thank you for coming to see [place] at [facility]. I hope we have been able to provide you high quality care today.  You may receive a patient satisfaction survey over the next few weeks. We would appreciate your feedback and comments so that we may continue to improve ourselves and the health of our patients.

## 2023-10-05 NOTE — Telephone Encounter (Signed)
RX sent to [facility] for the patient

## 2023-10-05 NOTE — Telephone Encounter (Signed)
-----   Message from Ihor Austin sent at 10/05/2023  8:43 AM EST ----- Please initiate Botox. Thank you

## 2023-10-05 NOTE — Telephone Encounter (Signed)
Chronic Migraine CPT 64615  Botox [id number] Units:200  G43.709 Chronic Migraine without aura, not intractable, without status migrainous   

## 2023-10-05 NOTE — Addendum Note (Signed)
Addended by: Judi Cong on: 10/05/2023 09:23 AM   Modules accepted: Orders

## 2023-10-13 ENCOUNTER — Telehealth: Payer: Self-pay | Admitting: Adult Health

## 2023-10-13 NOTE — Telephone Encounter (Signed)
Returned [facility] call, rep states that they need pt's consent to ship Botox. They left a message and will continue to call. We tentatively set delivery up for 2/18, pending pt call back.

## 2023-10-13 NOTE — Telephone Encounter (Signed)
[facility] Nicholos Johns) Botox delivery on 10/17/23. Call back to confirm clinically information

## 2023-10-17 ENCOUNTER — Ambulatory Visit: Payer: 59 | Admitting: Adult Health

## 2023-10-17 ENCOUNTER — Other Ambulatory Visit (HOSPITAL_BASED_OUTPATIENT_CLINIC_OR_DEPARTMENT_OTHER): Payer: Self-pay

## 2023-10-17 NOTE — Telephone Encounter (Signed)
Botox TBD 2/11.

## 2023-10-17 NOTE — Telephone Encounter (Signed)
[facility] Thurnell Lose) calling to for delivery of shipment for Botox. Please call back to schedule delivery of Botox to [phone].

## 2023-10-24 ENCOUNTER — Telehealth: Payer: Self-pay | Admitting: Adult Health

## 2023-10-24 NOTE — Telephone Encounter (Signed)
 Noted.

## 2023-10-24 NOTE — Telephone Encounter (Signed)
 Jade from [facility] called to arrange delivery of Botox  for 10-26-23 SDV ,200 unit, powder, 90 day supply,

## 2023-10-31 ENCOUNTER — Ambulatory Visit: Payer: 59 | Admitting: Adult Health

## 2023-10-31 ENCOUNTER — Other Ambulatory Visit (HOSPITAL_BASED_OUTPATIENT_CLINIC_OR_DEPARTMENT_OTHER): Payer: Self-pay

## 2023-10-31 VITALS — BP 142/85 | HR 78

## 2023-10-31 DIAGNOSIS — G43709 Chronic migraine without aura, not intractable, without status migrainosus: Secondary | ICD-10-CM | POA: Diagnosis not present

## 2023-10-31 MED ORDER — ONABOTULINUMTOXINA 200 UNITS IJ SOLR
155.0000 [IU] | Freq: Once | INTRAMUSCULAR | Status: AC
  Administered 2023-10-31: 155 [IU] via INTRAMUSCULAR

## 2023-10-31 NOTE — Progress Notes (Signed)
 Botox- 200 units x 1 vial Lot: [id number] Expiration: 12/2025 NDC: 5409-8119-14  Bacteriostatic 0.9% Sodium Chloride- 4mL total [id number] Expiration: 07/14/2024 NDC: 0865-7846-96  Dx: [id number]  SP  Witnessed by: Clemencia Course

## 2023-10-31 NOTE — Progress Notes (Signed)
 Update 10/31/2023 JM: Patient is being seen for initial Botox injection.  She continues to have about 27 migraine days per month.  She remains on Qulipta daily and use of rizatriptan as needed. Has noticed storms can be a trigger. She tolerated procedure well today. Will return in 3 months for repeat injection.         Consent Form Botulism Toxin Injection For Chronic Migraine    Reviewed orally with patient, additionally signature is on file:  Botulism toxin has been approved by the Federal drug administration for treatment of chronic migraine. Botulism toxin does not cure chronic migraine and it may not be effective in some patients.  The administration of botulism toxin is accomplished by injecting a small amount of toxin into the muscles of the neck and head. Dosage must be titrated for each individual. Any benefits resulting from botulism toxin tend to wear off after 3 months with a repeat injection required if benefit is to be maintained. Injections are usually done every 3-4 months with maximum effect peak achieved by about 2 or 3 weeks. Botulism toxin is expensive and you should be sure of what costs you will incur resulting from the injection.  The side effects of botulism toxin use for chronic migraine may include:   -Transient, and usually mild, facial weakness with facial injections  -Transient, and usually mild, head or neck weakness with head/neck injections  -Reduction or loss of forehead facial animation due to forehead muscle weakness  -Eyelid drooping  -Dry eye  -Pain at the site of injection or bruising at the site of injection  -Double vision  -Potential unknown long term risks   Contraindications: You should not have Botox if you are pregnant, nursing, allergic to albumin, have an infection, skin condition, or muscle weakness at the site of the injection, or have myasthenia gravis, Lambert-Eaton syndrome, or ALS.  It is also possible that as with any  injection, there may be an allergic reaction or no effect from the medication. Reduced effectiveness after repeated injections is sometimes seen and rarely infection at the injection site may occur. All care will be taken to prevent these side effects. If therapy is given over a long time, atrophy and wasting in the muscle injected may occur. Occasionally the patient's become refractory to treatment because they develop antibodies to the toxin. In this event, therapy needs to be modified.  I have read the above information and consent to the administration of botulism toxin.    BOTOX PROCEDURE NOTE FOR MIGRAINE HEADACHE  Contraindications and precautions discussed with patient(above). Aseptic procedure was observed and patient tolerated procedure. Procedure performed by Ihor Austin, AGNP-BC.   The condition has existed for more than 6 months, and pt does not have a diagnosis of ALS, Myasthenia Gravis or Lambert-Eaton Syndrome.  Risks and benefits of injections discussed and pt agrees to proceed with the procedure.  Written consent obtained  These injections are medically necessary. Pt  receives good benefits from these injections. These injections do not cause sedations or hallucinations which the oral therapies may cause.   Description of procedure:  The patient was placed in a sitting position. The standard protocol was used for Botox as follows, with 5 units of Botox injected at each site:  -Procerus muscle, midline injection  -Corrugator muscle, bilateral injection  -Frontalis muscle, bilateral injection, with 2 sites each side, medial injection was performed in the upper one third of the frontalis muscle, in the region vertical from  the medial inferior edge of the superior orbital rim. The lateral injection was again in the upper one third of the forehead vertically above the lateral limbus of the cornea, 1.5 cm lateral to the medial injection site.  -Temporalis muscle injection, 4  sites, bilaterally. The first injection was 3 cm above the tragus of the ear, second injection site was 1.5 cm to 3 cm up from the first injection site in line with the tragus of the ear. The third injection site was 1.5-3 cm forward between the first 2 injection sites. The fourth injection site was 1.5 cm posterior to the second injection site. 5th site laterally in the temporalis  muscleat the level of the outer canthus.  -Occipitalis muscle injection, 3 sites, bilaterally. The first injection was done one half way between the occipital protuberance and the tip of the mastoid process behind the ear. The second injection site was done lateral and superior to the first, 1 fingerbreadth from the first injection. The third injection site was 1 fingerbreadth superiorly and medially from the first injection site.  -Cervical paraspinal muscle injection, 2 sites, bilaterally. The first injection site was 1 cm from the midline of the cervical spine, 3 cm inferior to the lower border of the occipital protuberance. The second injection site was 1.5 cm superiorly and laterally to the first injection site.  -Trapezius muscle injection was performed at 3 sites, bilaterally. The first injection site was in the upper trapezius muscle halfway between the inflection point of the neck, and the acromion. The second injection site was one half way between the acromion and the first injection site. The third injection was done between the first injection site and the inflection point of the neck.    A total of 200 units of Botox was prepared, 155 units of Botox was injected as documented above, any Botox not injected was wasted. The patient tolerated the procedure well, there were no complications of the above procedure.   Ihor Austin, AGNP-BC  [facility] [address] [place], [place] [address]  Phone [phone] Fax [phone] Note: This document was prepared with digital  dictation and possible smart phrase technology. Any transcriptional errors that result from this process are unintentional.

## 2023-11-03 ENCOUNTER — Ambulatory Visit: Payer: 59 | Admitting: Adult Health

## 2023-11-16 ENCOUNTER — Ambulatory Visit: Payer: 59 | Admitting: Adult Health

## 2023-11-26 ENCOUNTER — Other Ambulatory Visit (HOSPITAL_BASED_OUTPATIENT_CLINIC_OR_DEPARTMENT_OTHER): Payer: Self-pay

## 2023-11-28 ENCOUNTER — Other Ambulatory Visit: Payer: Self-pay

## 2023-11-28 ENCOUNTER — Other Ambulatory Visit (HOSPITAL_BASED_OUTPATIENT_CLINIC_OR_DEPARTMENT_OTHER): Payer: Self-pay

## 2024-01-24 ENCOUNTER — Ambulatory Visit: Payer: 59 | Admitting: Adult Health

## 2024-01-24 VITALS — BP 128/77 | HR 78

## 2024-01-24 DIAGNOSIS — G43709 Chronic migraine without aura, not intractable, without status migrainosus: Secondary | ICD-10-CM

## 2024-01-24 DIAGNOSIS — G43109 Migraine with aura, not intractable, without status migrainosus: Secondary | ICD-10-CM

## 2024-01-24 MED ORDER — ONABOTULINUMTOXINA 200 UNITS IJ SOLR
155.0000 [IU] | Freq: Once | INTRAMUSCULAR | Status: AC
  Administered 2024-01-24: 155 [IU] via INTRAMUSCULAR

## 2024-01-24 NOTE — Progress Notes (Signed)
 Update 01/24/2024 JM: Patient is being seen for repeat Botox  injection, prior initial injection 10/31/2023.  Reports gradual improvement of migraines since initial Botox .  Prior to Botox , reported 27 migraine days per month, since Botox  reports 7-8 in March and 3-4 in April but only 2 in April were severe enough to take a rescue medication.  Remains on Qulipta  daily and use of rizatriptan  as needed with benefit.  Tolerated procedure well today. Will return in 3 months for repeat injection.         Consent Form Botulism Toxin Injection For Chronic Migraine    Reviewed orally with patient, additionally signature is on file:  Botulism toxin has been approved by the Federal drug administration for treatment of chronic migraine. Botulism toxin does not cure chronic migraine and it may not be effective in some patients.  The administration of botulism toxin is accomplished by injecting a small amount of toxin into the muscles of the neck and head. Dosage must be titrated for each individual. Any benefits resulting from botulism toxin tend to wear off after 3 months with a repeat injection required if benefit is to be maintained. Injections are usually done every 3-4 months with maximum effect peak achieved by about 2 or 3 weeks. Botulism toxin is expensive and you should be sure of what costs you will incur resulting from the injection.  The side effects of botulism toxin use for chronic migraine may include:   -Transient, and usually mild, facial weakness with facial injections  -Transient, and usually mild, head or neck weakness with head/neck injections  -Reduction or loss of forehead facial animation due to forehead muscle weakness  -Eyelid drooping  -Dry eye  -Pain at the site of injection or bruising at the site of injection  -Double vision  -Potential unknown long term risks   Contraindications: You should not have Botox  if you are pregnant, nursing, allergic to albumin, have an  infection, skin condition, or muscle weakness at the site of the injection, or have myasthenia gravis, Lambert-Eaton syndrome, or ALS.  It is also possible that as with any injection, there may be an allergic reaction or no effect from the medication. Reduced effectiveness after repeated injections is sometimes seen and rarely infection at the injection site may occur. All care will be taken to prevent these side effects. If therapy is given over a long time, atrophy and wasting in the muscle injected may occur. Occasionally the patient's become refractory to treatment because they develop antibodies to the toxin. In this event, therapy needs to be modified.  I have read the above information and consent to the administration of botulism toxin.    BOTOX  PROCEDURE NOTE FOR MIGRAINE HEADACHE  Contraindications and precautions discussed with patient(above). Aseptic procedure was observed and patient tolerated procedure. Procedure performed by Johny Nap, AGNP-BC.   The condition has existed for more than 6 months, and pt does not have a diagnosis of ALS, Myasthenia Gravis or Lambert-Eaton Syndrome.  Risks and benefits of injections discussed and pt agrees to proceed with the procedure.  Written consent obtained  These injections are medically necessary. Pt  receives good benefits from these injections. These injections do not cause sedations or hallucinations which the oral therapies may cause.   Description of procedure:  The patient was placed in a sitting position. The standard protocol was used for Botox  as follows, with 5 units of Botox  injected at each site:  -Procerus muscle, midline injection  -Corrugator muscle, bilateral  injection  -Frontalis muscle, bilateral injection, with 2 sites each side, medial injection was performed in the upper one third of the frontalis muscle, in the region vertical from the medial inferior edge of the superior orbital rim. The lateral injection was  again in the upper one third of the forehead vertically above the lateral limbus of the cornea, 1.5 cm lateral to the medial injection site.  -Temporalis muscle injection, 4 sites, bilaterally. The first injection was 3 cm above the tragus of the ear, second injection site was 1.5 cm to 3 cm up from the first injection site in line with the tragus of the ear. The third injection site was 1.5-3 cm forward between the first 2 injection sites. The fourth injection site was 1.5 cm posterior to the second injection site. 5th site laterally in the temporalis  muscleat the level of the outer canthus.  -Occipitalis muscle injection, 3 sites, bilaterally. The first injection was done one half way between the occipital protuberance and the tip of the mastoid process behind the ear. The second injection site was done lateral and superior to the first, 1 fingerbreadth from the first injection. The third injection site was 1 fingerbreadth superiorly and medially from the first injection site.  -Cervical paraspinal muscle injection, 2 sites, bilaterally. The first injection site was 1 cm from the midline of the cervical spine, 3 cm inferior to the lower border of the occipital protuberance. The second injection site was 1.5 cm superiorly and laterally to the first injection site.  -Trapezius muscle injection was performed at 3 sites, bilaterally. The first injection site was in the upper trapezius muscle halfway between the inflection point of the neck, and the acromion. The second injection site was one half way between the acromion and the first injection site. The third injection was done between the first injection site and the inflection point of the neck.    A total of 200 units of Botox  was prepared, 155 units of Botox  was injected as documented above, any Botox  not injected was wasted. The patient tolerated the procedure well, there were no complications of the above procedure.   Johny Nap,  AGNP-BC  [facility] [address] [place], [place] [address]  Phone [phone] Fax [phone] Note: This document was prepared with digital dictation and possible smart phrase technology. Any transcriptional errors that result from this process are unintentional.

## 2024-01-24 NOTE — Progress Notes (Signed)
 Botox - 200 units x 1 vial Lot: [id number] Expiration: 05/2026 NDC: 1610-9604-54   Bacteriostatic 0.9% Sodium Chloride - 4mL total [id number] Expiration: 07/14/2024 NDC: 8295-6213-08   Dx: M57.846  SP   Witnessed by: Arville Laughter

## 2024-03-02 ENCOUNTER — Other Ambulatory Visit: Payer: Self-pay

## 2024-03-05 ENCOUNTER — Other Ambulatory Visit (HOSPITAL_BASED_OUTPATIENT_CLINIC_OR_DEPARTMENT_OTHER): Payer: Self-pay

## 2024-04-02 ENCOUNTER — Other Ambulatory Visit (HOSPITAL_BASED_OUTPATIENT_CLINIC_OR_DEPARTMENT_OTHER): Payer: Self-pay

## 2024-04-03 ENCOUNTER — Other Ambulatory Visit: Payer: Self-pay

## 2024-04-04 ENCOUNTER — Other Ambulatory Visit: Payer: Self-pay

## 2024-04-23 ENCOUNTER — Other Ambulatory Visit (HOSPITAL_BASED_OUTPATIENT_CLINIC_OR_DEPARTMENT_OTHER): Payer: Self-pay

## 2024-04-23 ENCOUNTER — Ambulatory Visit: Admitting: Adult Health

## 2024-04-23 VITALS — BP 140/89 | HR 69

## 2024-04-23 DIAGNOSIS — G43109 Migraine with aura, not intractable, without status migrainosus: Secondary | ICD-10-CM

## 2024-04-23 DIAGNOSIS — G43709 Chronic migraine without aura, not intractable, without status migrainosus: Secondary | ICD-10-CM

## 2024-04-23 MED ORDER — QULIPTA 60 MG PO TABS
60.0000 mg | ORAL_TABLET | Freq: Every day | ORAL | 3 refills | Status: AC
  Filled 2024-04-23: qty 90, 90d supply, fill #0
  Filled 2024-04-25: qty 30, 30d supply, fill #0
  Filled 2024-06-05: qty 30, 30d supply, fill #1
  Filled 2024-06-27: qty 30, 30d supply, fill #2
  Filled 2024-08-02: qty 30, 30d supply, fill #3
  Filled 2024-09-07: qty 30, 30d supply, fill #4
  Filled 2024-10-02: qty 30, 30d supply, fill #5

## 2024-04-23 MED ORDER — ONABOTULINUMTOXINA 200 UNITS IJ SOLR
155.0000 [IU] | Freq: Once | INTRAMUSCULAR | Status: AC
  Administered 2024-04-23 (×2): 155 [IU] via INTRAMUSCULAR

## 2024-04-23 NOTE — Progress Notes (Signed)
 Update 04/23/2024 JM: Patient is being seen for repeat Botox  injection, prior injection 01/24/2024.  Reports continued benefit with Botox , did have increase in migraines in June and beginning of July but attributes this to sinus infection and heat. She has had 1 migraine in the past month.  Prior to Botox , reported 27 migraine days per month.  Also remains on Qulipta .  Use of rizatriptan  as needed with benefit. Tolerated procedure well today. Will return in 3 months for repeat injection.         Consent Form Botulism Toxin Injection For Chronic Migraine    Reviewed orally with patient, additionally signature is on file:  Botulism toxin has been approved by the Federal drug administration for treatment of chronic migraine. Botulism toxin does not cure chronic migraine and it may not be effective in some patients.  The administration of botulism toxin is accomplished by injecting a small amount of toxin into the muscles of the neck and head. Dosage must be titrated for each individual. Any benefits resulting from botulism toxin tend to wear off after 3 months with a repeat injection required if benefit is to be maintained. Injections are usually done every 3-4 months with maximum effect peak achieved by about 2 or 3 weeks. Botulism toxin is expensive and you should be sure of what costs you will incur resulting from the injection.  The side effects of botulism toxin use for chronic migraine may include:   -Transient, and usually mild, facial weakness with facial injections  -Transient, and usually mild, head or neck weakness with head/neck injections  -Reduction or loss of forehead facial animation due to forehead muscle weakness  -Eyelid drooping  -Dry eye  -Pain at the site of injection or bruising at the site of injection  -Double vision  -Potential unknown long term risks   Contraindications: You should not have Botox  if you are pregnant, nursing, allergic to albumin, have an  infection, skin condition, or muscle weakness at the site of the injection, or have myasthenia gravis, Lambert-Eaton syndrome, or ALS.  It is also possible that as with any injection, there may be an allergic reaction or no effect from the medication. Reduced effectiveness after repeated injections is sometimes seen and rarely infection at the injection site may occur. All care will be taken to prevent these side effects. If therapy is given over a long time, atrophy and wasting in the muscle injected may occur. Occasionally the patient's become refractory to treatment because they develop antibodies to the toxin. In this event, therapy needs to be modified.  I have read the above information and consent to the administration of botulism toxin.    BOTOX  PROCEDURE NOTE FOR MIGRAINE HEADACHE  Contraindications and precautions discussed with patient(above). Aseptic procedure was observed and patient tolerated procedure. Procedure performed by Harlene Bogaert, AGNP-BC.   The condition has existed for more than 6 months, and pt does not have a diagnosis of ALS, Myasthenia Gravis or Lambert-Eaton Syndrome.  Risks and benefits of injections discussed and pt agrees to proceed with the procedure.  Written consent obtained  These injections are medically necessary. Pt  receives good benefits from these injections. These injections do not cause sedations or hallucinations which the oral therapies may cause.   Description of procedure:  The patient was placed in a sitting position. The standard protocol was used for Botox  as follows, with 5 units of Botox  injected at each site:  -Procerus muscle, midline injection  -Corrugator muscle, bilateral  injection  -Frontalis muscle, bilateral injection, with 2 sites each side, medial injection was performed in the upper one third of the frontalis muscle, in the region vertical from the medial inferior edge of the superior orbital rim. The lateral injection was  again in the upper one third of the forehead vertically above the lateral limbus of the cornea, 1.5 cm lateral to the medial injection site.  -Temporalis muscle injection, 4 sites, bilaterally. The first injection was 3 cm above the tragus of the ear, second injection site was 1.5 cm to 3 cm up from the first injection site in line with the tragus of the ear. The third injection site was 1.5-3 cm forward between the first 2 injection sites. The fourth injection site was 1.5 cm posterior to the second injection site. 5th site laterally in the temporalis  muscleat the level of the outer canthus.  -Occipitalis muscle injection, 3 sites, bilaterally. The first injection was done one half way between the occipital protuberance and the tip of the mastoid process behind the ear. The second injection site was done lateral and superior to the first, 1 fingerbreadth from the first injection. The third injection site was 1 fingerbreadth superiorly and medially from the first injection site.  -Cervical paraspinal muscle injection, 2 sites, bilaterally. The first injection site was 1 cm from the midline of the cervical spine, 3 cm inferior to the lower border of the occipital protuberance. The second injection site was 1.5 cm superiorly and laterally to the first injection site.  -Trapezius muscle injection was performed at 3 sites, bilaterally. The first injection site was in the upper trapezius muscle halfway between the inflection point of the neck, and the acromion. The second injection site was one half way between the acromion and the first injection site. The third injection was done between the first injection site and the inflection point of the neck.    A total of 200 units of Botox  was prepared, 155 units of Botox  was injected as documented above, any Botox  not injected was wasted. The patient tolerated the procedure well, there were no complications of the above procedure.   Harlene Bogaert,  AGNP-BC  [facility] [address] [place], [place] [address]  Phone [phone] Fax [phone] Note: This document was prepared with digital dictation and possible smart phrase technology. Any transcriptional errors that result from this process are unintentional.

## 2024-04-23 NOTE — Progress Notes (Signed)
 Botox - 200 units x 1 vial Lot: [id number] Expiration: 07/2026 NDC: 9976-6078-97   Bacteriostatic 0.9% Sodium Chloride - 4mL total [id number] Expiration: 07/13/2025 NDC: 9590-8033-97   Dx: G43.709  SP   Witnessed ab:Rjdz B

## 2024-04-25 ENCOUNTER — Other Ambulatory Visit (HOSPITAL_BASED_OUTPATIENT_CLINIC_OR_DEPARTMENT_OTHER): Payer: Self-pay

## 2024-05-03 ENCOUNTER — Other Ambulatory Visit (HOSPITAL_BASED_OUTPATIENT_CLINIC_OR_DEPARTMENT_OTHER): Payer: Self-pay

## 2024-05-11 NOTE — Progress Notes (Signed)
 [facility] [address], [address] [place], [place] [address] [phone]  Date:  05/16/2024   Name:  Betty Hall   DOB:  07/06/1976   MRN:  [id number]  PCP:  Watt Harlene BROCKS, MD    Chief Complaint: Annual Exam   History of Present Illness:  Betty Hall is a 48 y.o. very pleasant female patient who presents with the following:  Patient seen today for physical exam.  I saw her most recently about 1 year ago, also for physical  History of migraine headache, tobacco use, polycythemia thought secondary to tobacco  Her other doctors include neurology and gynecology Her most recent neurology visit was in January, she does get regular Botox  injections for headache management She is on her 3rd round and it is helping her She is also using maxalt  as needed and qulipta    Pap smear up-to-date, 12/24 Mammogram December/24th Colonoscopy 2024 Can update labs   She has an IUD for contraception- she got this about 2 years ago  Exercise- she tries to stay active day to day but nothing formal  no CP or SOB  Tobacco use- about 1/2 PPD Alcohol- on occasion   Discussed the use of AI scribe software for clinical note transcription with the patient, who gave verbal consent to proceed.     Patient Active Problem List   Diagnosis Date Noted   Well woman exam with routine gynecological exam 08/16/2023   Uses hormone releasing intrauterine device (IUD) for contraception 08/16/2023   Polycythemia, secondary 05/17/2023   Chronic migraine w/o aura w/o status migrainosus, not intractable 06/17/2022   Snores 06/17/2022   Paresthesia 04/30/2021   IUD (intrauterine device) in place 09/24/2016   Smoking 1/2 pack a day or less 12/11/2014   CIN II (cervical intraepithelial neoplasia II) 11/27/2012   Galactorrhea 11/27/2012   Loss of weight 11/27/2012    Past Medical History:  Diagnosis Date   Hypertension    Migraines     Osteoarthritis of facet joint at L5-S1 level of lumbosacral spine    herniated & buldging disc    Past Surgical History:  Procedure Laterality Date   CERVICAL BIOPSY  W/ LOOP ELECTRODE EXCISION  09/13/1997   CIN II   GALLBLADDER SURGERY  09/13/2000   INTRAUTERINE DEVICE INSERTION     mirena  inserted 09-24-16   knee surgeries  2011, 2010, 2009   KNEE SURGERY Left 10/15/2015    Social History   Tobacco Use   Smoking status: Former    Current packs/day: 0.50    Average packs/day: 0.5 packs/day for 16.0 years (8.0 ttl pk-yrs)    Types: Cigarettes    Start date: 05/16/2008   Smokeless tobacco: Never  Vaping Use   Vaping status: Some Days  Substance Use Topics   Alcohol use: Yes    Comment: SOCIAL   Drug use: No    Family History  Problem Relation Age of Onset   Hypertension Father    Heart disease Maternal Grandmother    Diabetes Maternal Grandmother    Rheum arthritis Maternal Grandmother    Cancer Maternal Grandfather        COLON   Hypertension Paternal Grandfather     Allergies  Allergen Reactions   Prednisone  Hypertension    Medication list has been reviewed and updated.  Current Outpatient Medications on File Prior to Visit  Medication Sig Dispense Refill   Atogepant  (QULIPTA ) 60 MG TABS Take 1  tablet (60 mg total) by mouth daily. 90 tablet 3   botulinum toxin Type A  (BOTOX ) 200 units injection Inject 155 Units into the muscle every 3 (three) months. 1 each 3   levonorgestrel  (MIRENA ) 20 MCG/DAY IUD 1 each by Intrauterine route once.     loratadine (CLARITIN) 10 MG tablet Take 10 mg by mouth daily.     ondansetron  (ZOFRAN ) 4 MG tablet Take 1 tablet (4 mg total) by mouth every 8 (eight) hours as needed for nausea or vomiting. 20 tablet 3   rizatriptan  (MAXALT -MLT) 10 MG disintegrating tablet Take 1 tablet (10 mg total) by mouth as needed. May repeat in 2 hours if needed 12 tablet 11   rizatriptan  (MAXALT -MLT) 5 MG disintegrating tablet Dissolve 1 tablet under  the tongue as needed, May repeat in 2 hours if needed 10 tablet 12   No current facility-administered medications on file prior to visit.    Review of Systems:  As per HPI- otherwise negative.   Physical Examination: Vitals:   05/16/24 1312  BP: 132/78  Pulse: 61  Temp: 98.5 F (36.9 C)   Vitals:   05/16/24 1312  Weight: 133 lb (60.3 kg)  Height: 5' 1 (1.549 m)   Body mass index is 25.13 kg/m. Ideal Body Weight: Weight in (lb) to have BMI = 25: 132  GEN: no acute distress.  Normal weight, looks well  Mild all over hair thinning -no bald patches  HEENT: Atraumatic, Normocephalic.  Bilateral TM wnl, oropharynx normal.  PEERL,EOMI.   Ears and Nose: No external deformity. CV: RRR, No M/G/R. No JVD. No thrill. No extra heart sounds. PULM: CTA B, no wheezes, crackles, rhonchi. No retractions. No resp. distress. No accessory muscle use. ABD: S, NT, ND, +BS. No rebound. No HSM. EXTR: No c/c/e PSYCH: Normally interactive. Conversant.    Assessment and Plan: Physical exam  Screening, lipid - Plan: Lipid panel  Screening for diabetes mellitus - Plan: Comprehensive metabolic panel with GFR, Hemoglobin A1c  Thyroid  disorder screening - Plan: TSH  Screening for deficiency anemia - Plan: CBC  Polycythemia - Plan: CBC  Chronic migraine w/o aura w/o status migrainosus, not intractable  Thinning hair - Plan: Ferritin  Pt seen today for CPE Encouraged healthy diet and exercise routine Encourage quit smoking Flu shot  Will plan further follow- up pending labs. Thinning hair- check thyroid , iron, can try topical rogaine   Signed Harlene Schroeder, MD  Addendum 9/4, received labs as below.  Message to patient Results for orders placed or performed in visit on 05/16/24  CBC   Collection Time: 05/16/24  1:38 PM  Result Value Ref Range   WBC 6.9 4.0 - 10.5 K/uL   RBC 5.31 (H) 3.87 - 5.11 Mil/uL   Platelets 157.0 150.0 - 400.0 K/uL   Hemoglobin 16.7 (H) 12.0 - 15.0 g/dL    HCT 50.3 (H) 63.9 - 46.0 %   MCV 93.6 78.0 - 100.0 fl   MCHC 33.7 30.0 - 36.0 g/dL   RDW 86.5 88.4 - 84.4 %  Comprehensive metabolic panel with GFR   Collection Time: 05/16/24  1:38 PM  Result Value Ref Range   Sodium 140 135 - 145 mEq/L   Potassium 4.1 3.5 - 5.1 mEq/L   Chloride 100 96 - 112 mEq/L   CO2 30 19 - 32 mEq/L   Glucose, Bld 78 70 - 99 mg/dL   BUN 8 6 - 23 mg/dL   Creatinine, Ser 9.21 0.40 - 1.20 mg/dL  Total Bilirubin 1.0 0.2 - 1.2 mg/dL   Alkaline Phosphatase 42 39 - 117 U/L   AST 14 0 - 37 U/L   ALT 13 0 - 35 U/L   Total Protein 7.0 6.0 - 8.3 g/dL   Albumin 4.7 3.5 - 5.2 g/dL   GFR 09.23 >39.99 mL/min   Calcium 9.6 8.4 - 10.5 mg/dL  Hemoglobin J8r   Collection Time: 05/16/24  1:38 PM  Result Value Ref Range   Hgb A1c MFr Bld 5.4 4.6 - 6.5 %  Lipid panel   Collection Time: 05/16/24  1:38 PM  Result Value Ref Range   Cholesterol 184 0 - 200 mg/dL   Triglycerides 894.9 0.0 - 149.0 mg/dL   HDL 45.99 >60.99 mg/dL   VLDL 78.9 0.0 - 59.9 mg/dL   LDL Cholesterol 890 (H) 0 - 99 mg/dL   Total CHOL/HDL Ratio 3    NonHDL 129.50   TSH   Collection Time: 05/16/24  1:38 PM  Result Value Ref Range   TSH 1.10 0.35 - 5.50 uIU/mL  Ferritin   Collection Time: 05/16/24  1:38 PM  Result Value Ref Range   Ferritin 91.9 10.0 - 291.0 ng/mL

## 2024-05-11 NOTE — Patient Instructions (Addendum)
 It was great to see you again today, I will be in touch with your labs as soon as possible Keep working on exercise and smoking cessation !   OTC Rogaine foam (generic ok) can be helpful in reducing hair loss, and I will also check your thyroid  and your iron   Take care!

## 2024-05-16 ENCOUNTER — Encounter: Admitting: Family Medicine

## 2024-05-16 ENCOUNTER — Ambulatory Visit: Admitting: Family Medicine

## 2024-05-16 VITALS — BP 132/78 | HR 61 | Temp 98.5°F | Ht 61.0 in | Wt 133.0 lb

## 2024-05-16 DIAGNOSIS — Z23 Encounter for immunization: Secondary | ICD-10-CM

## 2024-05-16 DIAGNOSIS — Z0001 Encounter for general adult medical examination with abnormal findings: Secondary | ICD-10-CM | POA: Diagnosis not present

## 2024-05-16 DIAGNOSIS — L659 Nonscarring hair loss, unspecified: Secondary | ICD-10-CM | POA: Diagnosis not present

## 2024-05-16 DIAGNOSIS — G43709 Chronic migraine without aura, not intractable, without status migrainosus: Secondary | ICD-10-CM

## 2024-05-16 DIAGNOSIS — Z0000 Encounter for general adult medical examination without abnormal findings: Secondary | ICD-10-CM

## 2024-05-16 DIAGNOSIS — Z1329 Encounter for screening for other suspected endocrine disorder: Secondary | ICD-10-CM | POA: Diagnosis not present

## 2024-05-16 DIAGNOSIS — D751 Secondary polycythemia: Secondary | ICD-10-CM

## 2024-05-16 DIAGNOSIS — Z13220 Encounter for screening for lipoid disorders: Secondary | ICD-10-CM

## 2024-05-16 DIAGNOSIS — Z130 Encounter for screening for diseases of the blood and blood-forming organs and certain disorders involving the immune mechanism: Secondary | ICD-10-CM

## 2024-05-16 DIAGNOSIS — Z131 Encounter for screening for diabetes mellitus: Secondary | ICD-10-CM

## 2024-05-16 NOTE — Addendum Note (Signed)
 Addended by: Quinlee Sciarra M on: 05/16/2024 01:44 PM   Modules accepted: Orders

## 2024-05-17 ENCOUNTER — Encounter: Payer: Self-pay | Admitting: Family Medicine

## 2024-05-17 LAB — CBC
HCT: 49.6 % — ABNORMAL HIGH (ref 36.0–46.0)
Hemoglobin: 16.7 g/dL — ABNORMAL HIGH (ref 12.0–15.0)
MCHC: 33.7 g/dL (ref 30.0–36.0)
MCV: 93.6 fl (ref 78.0–100.0)
Platelets: 157 K/uL (ref 150.0–400.0)
RBC: 5.31 Mil/uL — ABNORMAL HIGH (ref 3.87–5.11)
RDW: 13.4 % (ref 11.5–15.5)
WBC: 6.9 K/uL (ref 4.0–10.5)

## 2024-05-17 LAB — COMPREHENSIVE METABOLIC PANEL WITH GFR
ALT: 13 U/L (ref 0–35)
AST: 14 U/L (ref 0–37)
Albumin: 4.7 g/dL (ref 3.5–5.2)
Alkaline Phosphatase: 42 U/L (ref 39–117)
BUN: 8 mg/dL (ref 6–23)
CO2: 30 meq/L (ref 19–32)
Calcium: 9.6 mg/dL (ref 8.4–10.5)
Chloride: 100 meq/L (ref 96–112)
Creatinine, Ser: 0.78 mg/dL (ref 0.40–1.20)
GFR: 90.76 mL/min (ref >60.00)
Glucose, Bld: 78 mg/dL (ref 70–99)
Potassium: 4.1 meq/L (ref 3.5–5.1)
Sodium: 140 meq/L (ref 135–145)
Total Bilirubin: 1 mg/dL (ref 0.2–1.2)
Total Protein: 7 g/dL (ref 6.0–8.3)

## 2024-05-17 LAB — FERRITIN: Ferritin: 91.9 ng/mL (ref 10.0–291.0)

## 2024-05-17 LAB — HEMOGLOBIN A1C: Hgb A1c MFr Bld: 5.4 % (ref 4.6–6.5)

## 2024-05-17 LAB — LIPID PANEL
Cholesterol: 184 mg/dL (ref 0–200)
HDL: 54 mg/dL (ref >39.00)
LDL Cholesterol: 109 mg/dL — ABNORMAL HIGH (ref 0–99)
NonHDL: 129.5
Total CHOL/HDL Ratio: 3
Triglycerides: 105 mg/dL (ref 0.0–149.0)
VLDL: 21 mg/dL (ref 0.0–40.0)

## 2024-05-17 LAB — TSH: TSH: 1.1 u[IU]/mL (ref 0.35–5.50)

## 2024-05-21 ENCOUNTER — Encounter: Payer: Self-pay | Admitting: Family Medicine

## 2024-05-21 NOTE — Addendum Note (Signed)
 Addended by: WATT RAISIN C on: 05/21/2024 08:42 PM   Modules accepted: Orders

## 2024-05-24 ENCOUNTER — Encounter: Payer: Self-pay | Admitting: Family Medicine

## 2024-06-08 ENCOUNTER — Other Ambulatory Visit (HOSPITAL_BASED_OUTPATIENT_CLINIC_OR_DEPARTMENT_OTHER): Payer: Self-pay

## 2024-06-28 ENCOUNTER — Other Ambulatory Visit: Payer: Self-pay

## 2024-06-28 ENCOUNTER — Other Ambulatory Visit (HOSPITAL_BASED_OUTPATIENT_CLINIC_OR_DEPARTMENT_OTHER): Payer: Self-pay

## 2024-07-02 ENCOUNTER — Telehealth: Payer: Self-pay | Admitting: Pharmacist

## 2024-07-02 NOTE — Telephone Encounter (Signed)
 Pharmacy Patient Advocate Encounter  Received notification from [employer] that Prior Authorization for QULIPTA  60 MG PO TABS has been APPROVED from 07/02/2024 to 07/02/2025   PA #/Case ID/Reference #: [id number]

## 2024-07-02 NOTE — Telephone Encounter (Signed)
 Pharmacy Patient Advocate Encounter   Received notification from Cover[employer]eds that prior authorization for Qulipta  60MG  tablets is required/requested.   Insurance verification completed.   The patient is insured through [facility].   Per test claim: PA required; PA submitted to above mentioned insurance via Latent Key/confirmation #/EOC [id number] Status is pending

## 2024-07-19 ENCOUNTER — Ambulatory Visit: Admitting: Adult Health

## 2024-07-24 ENCOUNTER — Other Ambulatory Visit: Payer: Self-pay

## 2024-07-24 ENCOUNTER — Other Ambulatory Visit (HOSPITAL_COMMUNITY): Payer: Self-pay

## 2024-07-24 ENCOUNTER — Other Ambulatory Visit (HOSPITAL_BASED_OUTPATIENT_CLINIC_OR_DEPARTMENT_OTHER): Payer: Self-pay

## 2024-07-24 ENCOUNTER — Ambulatory Visit: Admitting: Adult Health

## 2024-07-24 VITALS — BP 150/85 | HR 78

## 2024-07-24 DIAGNOSIS — G43709 Chronic migraine without aura, not intractable, without status migrainosus: Secondary | ICD-10-CM

## 2024-07-24 DIAGNOSIS — G43109 Migraine with aura, not intractable, without status migrainosus: Secondary | ICD-10-CM

## 2024-07-24 MED ORDER — RIZATRIPTAN BENZOATE 5 MG PO TBDP
ORAL_TABLET | ORAL | 12 refills | Status: AC
  Filled 2024-07-24: qty 10, 5d supply, fill #0
  Filled 2024-09-07: qty 10, 5d supply, fill #1
  Filled 2024-10-02: qty 10, 5d supply, fill #2

## 2024-07-24 MED ORDER — RIZATRIPTAN BENZOATE 10 MG PO TBDP
10.0000 mg | ORAL_TABLET | ORAL | 11 refills | Status: AC | PRN
  Filled 2024-07-24: qty 10, 5d supply, fill #0
  Filled 2024-09-07: qty 10, 5d supply, fill #1
  Filled 2024-10-02: qty 10, 5d supply, fill #2

## 2024-07-24 MED ORDER — ONABOTULINUMTOXINA 200 UNITS IJ SOLR
155.0000 [IU] | Freq: Once | INTRAMUSCULAR | Status: AC
  Administered 2024-07-24: 155 [IU] via INTRAMUSCULAR

## 2024-07-24 NOTE — Progress Notes (Signed)
 Update 07/24/2024 JM: Patient is being seen for repeat Botox  injection, prior injection 04/23/2024.  Reports continued benefit with Botox , did have increase in migraines in October which she attributes to recurrent sinus infection then double ear infection then URI. Migraines have gradually improved and has had only 3 so far this month. Continues on Qulipta  and use of rizatriptan  as needed. Tolerated procedure well today. Will return in 3 months for repeat injection.         Consent Form Botulism Toxin Injection For Chronic Migraine    Reviewed orally with patient, additionally signature is on file:  Botulism toxin has been approved by the Federal drug administration for treatment of chronic migraine. Botulism toxin does not cure chronic migraine and it may not be effective in some patients.  The administration of botulism toxin is accomplished by injecting a small amount of toxin into the muscles of the neck and head. Dosage must be titrated for each individual. Any benefits resulting from botulism toxin tend to wear off after 3 months with a repeat injection required if benefit is to be maintained. Injections are usually done every 3-4 months with maximum effect peak achieved by about 2 or 3 weeks. Botulism toxin is expensive and you should be sure of what costs you will incur resulting from the injection.  The side effects of botulism toxin use for chronic migraine may include:   -Transient, and usually mild, facial weakness with facial injections  -Transient, and usually mild, head or neck weakness with head/neck injections  -Reduction or loss of forehead facial animation due to forehead muscle weakness  -Eyelid drooping  -Dry eye  -Pain at the site of injection or bruising at the site of injection  -Double vision  -Potential unknown long term risks   Contraindications: You should not have Botox  if you are pregnant, nursing, allergic to albumin, have an infection, skin  condition, or muscle weakness at the site of the injection, or have myasthenia gravis, Lambert-Eaton syndrome, or ALS.  It is also possible that as with any injection, there may be an allergic reaction or no effect from the medication. Reduced effectiveness after repeated injections is sometimes seen and rarely infection at the injection site may occur. All care will be taken to prevent these side effects. If therapy is given over a long time, atrophy and wasting in the muscle injected may occur. Occasionally the patient's become refractory to treatment because they develop antibodies to the toxin. In this event, therapy needs to be modified.  I have read the above information and consent to the administration of botulism toxin.    BOTOX  PROCEDURE NOTE FOR MIGRAINE HEADACHE  Contraindications and precautions discussed with patient(above). Aseptic procedure was observed and patient tolerated procedure. Procedure performed by Harlene Bogaert, AGNP-BC.   The condition has existed for more than 6 months, and pt does not have a diagnosis of ALS, Myasthenia Gravis or Lambert-Eaton Syndrome.  Risks and benefits of injections discussed and pt agrees to proceed with the procedure.  Written consent obtained  These injections are medically necessary. Pt  receives good benefits from these injections. These injections do not cause sedations or hallucinations which the oral therapies may cause.   Description of procedure:  The patient was placed in a sitting position. The standard protocol was used for Botox  as follows, with 5 units of Botox  injected at each site:  -Procerus muscle, midline injection  -Corrugator muscle, bilateral injection  -Frontalis muscle, bilateral injection, with 2 sites  each side, medial injection was performed in the upper one third of the frontalis muscle, in the region vertical from the medial inferior edge of the superior orbital rim. The lateral injection was again in the upper  one third of the forehead vertically above the lateral limbus of the cornea, 1.5 cm lateral to the medial injection site.  -Temporalis muscle injection, 4 sites, bilaterally. The first injection was 3 cm above the tragus of the ear, second injection site was 1.5 cm to 3 cm up from the first injection site in line with the tragus of the ear. The third injection site was 1.5-3 cm forward between the first 2 injection sites. The fourth injection site was 1.5 cm posterior to the second injection site. 5th site laterally in the temporalis  muscleat the level of the outer canthus.  -Occipitalis muscle injection, 3 sites, bilaterally. The first injection was done one half way between the occipital protuberance and the tip of the mastoid process behind the ear. The second injection site was done lateral and superior to the first, 1 fingerbreadth from the first injection. The third injection site was 1 fingerbreadth superiorly and medially from the first injection site.  -Cervical paraspinal muscle injection, 2 sites, bilaterally. The first injection site was 1 cm from the midline of the cervical spine, 3 cm inferior to the lower border of the occipital protuberance. The second injection site was 1.5 cm superiorly and laterally to the first injection site.  -Trapezius muscle injection was performed at 3 sites, bilaterally. The first injection site was in the upper trapezius muscle halfway between the inflection point of the neck, and the acromion. The second injection site was one half way between the acromion and the first injection site. The third injection was done between the first injection site and the inflection point of the neck.    A total of 200 units of Botox  was prepared, 155 units of Botox  was injected as documented above, any Botox  not injected was wasted. The patient tolerated the procedure well, there were no complications of the above procedure.   Harlene Bogaert, AGNP-BC  [facility] [address] [place], [place] [address]  Phone [phone] Fax [phone] Note: This document was prepared with digital dictation and possible smart phrase technology. Any transcriptional errors that result from this process are unintentional.

## 2024-07-24 NOTE — Progress Notes (Signed)
 Botox - 200 units x 1 vial Lot: [id number] Expiration: 02/2026 NDC: 9976-6078-97   Bacteriostatic 0.9% Sodium Chloride - 4mL total Onu:[id number] Expiration: 07/13/2025 NDC: 9590-8033-97   Dx: H56.290  SP   Witnessed by: Rojean DEL

## 2024-08-03 ENCOUNTER — Other Ambulatory Visit (HOSPITAL_BASED_OUTPATIENT_CLINIC_OR_DEPARTMENT_OTHER): Payer: Self-pay

## 2024-08-06 ENCOUNTER — Other Ambulatory Visit: Payer: Self-pay | Admitting: Obstetrics and Gynecology

## 2024-08-06 DIAGNOSIS — Z1231 Encounter for screening mammogram for malignant neoplasm of breast: Secondary | ICD-10-CM

## 2024-08-16 ENCOUNTER — Telehealth: Payer: Self-pay | Admitting: Adult Health

## 2024-08-16 NOTE — Telephone Encounter (Signed)
 MYC cxl

## 2024-08-16 NOTE — Telephone Encounter (Signed)
 Pt has called back and r/s

## 2024-09-03 ENCOUNTER — Ambulatory Visit

## 2024-09-03 DIAGNOSIS — Z1231 Encounter for screening mammogram for malignant neoplasm of breast: Secondary | ICD-10-CM

## 2024-09-10 ENCOUNTER — Ambulatory Visit: Payer: Self-pay | Admitting: Obstetrics and Gynecology

## 2024-10-07 ENCOUNTER — Other Ambulatory Visit: Payer: Self-pay | Admitting: Adult Health

## 2024-10-10 ENCOUNTER — Telehealth: Payer: Self-pay | Admitting: Adult Health

## 2024-10-10 NOTE — Telephone Encounter (Signed)
 Submitted auth renewal via [employer] portal, received approval. She will continue to fill through [facility].  Auth#: [id number] (10/10/24-10/10/25)

## 2024-10-18 ENCOUNTER — Ambulatory Visit: Admitting: Adult Health

## 2024-10-23 ENCOUNTER — Ambulatory Visit: Admitting: Adult Health

## 2024-11-05 ENCOUNTER — Ambulatory Visit: Admitting: Adult Health
